# Patient Record
Sex: Female | Born: 1955 | Race: White | Hispanic: No | Marital: Single | Smoking: Never smoker
Health system: Southern US, Community
[De-identification: ages and names within clinical notes are randomized; demographics above are authoritative.]

## PROBLEM LIST (undated history)

## (undated) DIAGNOSIS — R112 Nausea with vomiting, unspecified: Secondary | ICD-10-CM

## (undated) DIAGNOSIS — Z9889 Other specified postprocedural states: Secondary | ICD-10-CM

## (undated) DIAGNOSIS — K219 Gastro-esophageal reflux disease without esophagitis: Secondary | ICD-10-CM

## (undated) DIAGNOSIS — M1990 Unspecified osteoarthritis, unspecified site: Secondary | ICD-10-CM

## (undated) DIAGNOSIS — T8859XA Other complications of anesthesia, initial encounter: Secondary | ICD-10-CM

## (undated) DIAGNOSIS — E119 Type 2 diabetes mellitus without complications: Secondary | ICD-10-CM

## (undated) DIAGNOSIS — T4145XA Adverse effect of unspecified anesthetic, initial encounter: Secondary | ICD-10-CM

## (undated) DIAGNOSIS — Z8719 Personal history of other diseases of the digestive system: Secondary | ICD-10-CM

## (undated) DIAGNOSIS — R7303 Prediabetes: Secondary | ICD-10-CM

## (undated) DIAGNOSIS — K589 Irritable bowel syndrome without diarrhea: Secondary | ICD-10-CM

## (undated) HISTORY — PX: BACK SURGERY: SHX140

## (undated) HISTORY — PX: ABDOMINAL HYSTERECTOMY: SHX81

## (undated) HISTORY — PX: KNEE SURGERY: SHX244

## (undated) HISTORY — PX: CARPAL TUNNEL RELEASE: SHX101

## (undated) HISTORY — PX: SHOULDER SURGERY: SHX246

---

## 2004-01-26 ENCOUNTER — Other Ambulatory Visit: Payer: Self-pay

## 2004-08-06 ENCOUNTER — Ambulatory Visit: Payer: Self-pay | Admitting: Internal Medicine

## 2004-12-02 ENCOUNTER — Emergency Department: Payer: Self-pay | Admitting: Emergency Medicine

## 2004-12-02 ENCOUNTER — Ambulatory Visit: Payer: Self-pay | Admitting: Anesthesiology

## 2005-01-05 ENCOUNTER — Ambulatory Visit: Payer: Self-pay | Admitting: Anesthesiology

## 2005-02-03 ENCOUNTER — Ambulatory Visit: Payer: Self-pay | Admitting: Anesthesiology

## 2005-03-03 ENCOUNTER — Ambulatory Visit: Payer: Self-pay | Admitting: Anesthesiology

## 2005-10-13 ENCOUNTER — Ambulatory Visit: Payer: Self-pay | Admitting: Internal Medicine

## 2005-11-04 ENCOUNTER — Ambulatory Visit: Payer: Self-pay | Admitting: Unknown Physician Specialty

## 2006-02-22 ENCOUNTER — Ambulatory Visit: Payer: Self-pay | Admitting: Anesthesiology

## 2006-03-24 ENCOUNTER — Ambulatory Visit: Payer: Self-pay | Admitting: Unknown Physician Specialty

## 2006-03-31 ENCOUNTER — Ambulatory Visit: Payer: Self-pay | Admitting: Anesthesiology

## 2006-04-12 ENCOUNTER — Other Ambulatory Visit: Payer: Self-pay

## 2006-04-28 ENCOUNTER — Inpatient Hospital Stay: Payer: Self-pay | Admitting: Unknown Physician Specialty

## 2008-02-23 ENCOUNTER — Ambulatory Visit: Payer: Self-pay | Admitting: Internal Medicine

## 2008-07-12 ENCOUNTER — Ambulatory Visit: Payer: Self-pay | Admitting: Internal Medicine

## 2008-08-24 ENCOUNTER — Emergency Department: Payer: Self-pay | Admitting: Internal Medicine

## 2008-08-28 ENCOUNTER — Ambulatory Visit: Payer: Self-pay | Admitting: Gastroenterology

## 2008-09-27 ENCOUNTER — Emergency Department: Payer: Self-pay | Admitting: Emergency Medicine

## 2009-01-03 ENCOUNTER — Ambulatory Visit: Payer: Self-pay | Admitting: Orthopedic Surgery

## 2009-04-03 ENCOUNTER — Ambulatory Visit: Payer: Self-pay | Admitting: Orthopedic Surgery

## 2009-04-11 ENCOUNTER — Ambulatory Visit: Payer: Self-pay | Admitting: Orthopedic Surgery

## 2009-05-08 ENCOUNTER — Ambulatory Visit: Payer: Self-pay | Admitting: Orthopedic Surgery

## 2009-05-15 ENCOUNTER — Inpatient Hospital Stay: Payer: Self-pay | Admitting: Orthopedic Surgery

## 2009-08-23 ENCOUNTER — Ambulatory Visit: Payer: Self-pay | Admitting: Orthopedic Surgery

## 2009-09-08 ENCOUNTER — Ambulatory Visit: Payer: Self-pay | Admitting: Orthopedic Surgery

## 2009-10-10 ENCOUNTER — Ambulatory Visit: Payer: Self-pay | Admitting: Anesthesiology

## 2009-11-08 ENCOUNTER — Ambulatory Visit: Payer: Self-pay | Admitting: Anesthesiology

## 2009-11-23 ENCOUNTER — Ambulatory Visit: Payer: Self-pay | Admitting: Neurosurgery

## 2009-12-06 ENCOUNTER — Ambulatory Visit: Payer: Self-pay | Admitting: Anesthesiology

## 2010-01-29 ENCOUNTER — Ambulatory Visit: Payer: Self-pay | Admitting: Anesthesiology

## 2010-02-10 ENCOUNTER — Ambulatory Visit: Payer: Self-pay | Admitting: Anesthesiology

## 2010-02-11 ENCOUNTER — Encounter: Payer: Self-pay | Admitting: Anesthesiology

## 2010-02-20 ENCOUNTER — Ambulatory Visit: Payer: Self-pay | Admitting: Anesthesiology

## 2010-02-20 ENCOUNTER — Encounter: Payer: Self-pay | Admitting: Anesthesiology

## 2010-04-02 ENCOUNTER — Ambulatory Visit: Payer: Self-pay | Admitting: Anesthesiology

## 2010-05-15 ENCOUNTER — Ambulatory Visit: Payer: Self-pay | Admitting: Anesthesiology

## 2010-05-31 ENCOUNTER — Ambulatory Visit: Payer: Self-pay

## 2010-06-11 ENCOUNTER — Ambulatory Visit: Payer: Self-pay | Admitting: Anesthesiology

## 2010-07-09 ENCOUNTER — Ambulatory Visit: Payer: Self-pay | Admitting: Anesthesiology

## 2010-07-10 ENCOUNTER — Ambulatory Visit: Payer: Self-pay | Admitting: Orthopedic Surgery

## 2010-07-18 ENCOUNTER — Ambulatory Visit (HOSPITAL_COMMUNITY)
Admission: RE | Admit: 2010-07-18 | Discharge: 2010-07-18 | Payer: Self-pay | Source: Home / Self Care | Admitting: Neurosurgery

## 2010-08-15 ENCOUNTER — Ambulatory Visit: Payer: Self-pay | Admitting: Anesthesiology

## 2010-08-20 ENCOUNTER — Ambulatory Visit: Payer: Self-pay | Admitting: Thoracic Surgery

## 2010-10-09 LAB — CBC
HCT: 40 % (ref 36.0–46.0)
Hemoglobin: 13.3 g/dL (ref 12.0–15.0)
MCH: 29.2 pg (ref 26.0–34.0)
MCHC: 33.3 g/dL (ref 30.0–36.0)
MCV: 87.7 fL (ref 78.0–100.0)
Platelets: 273 10*3/uL (ref 150–400)
RBC: 4.56 MIL/uL (ref 3.87–5.11)
RDW: 13.2 % (ref 11.5–15.5)
WBC: 9.3 10*3/uL (ref 4.0–10.5)

## 2010-10-09 LAB — ABO/RH: ABO/RH(D): O POS

## 2010-10-13 ENCOUNTER — Inpatient Hospital Stay (HOSPITAL_COMMUNITY)
Admission: RE | Admit: 2010-10-13 | Discharge: 2010-10-21 | Payer: Self-pay | Source: Home / Self Care | Attending: Neurosurgery | Admitting: Neurosurgery

## 2010-10-20 LAB — CBC
HCT: 30.4 % — ABNORMAL LOW (ref 36.0–46.0)
HCT: 30 % — ABNORMAL LOW (ref 36.0–46.0)
HCT: 25.9 % — ABNORMAL LOW (ref 36.0–46.0)
HCT: 26.9 % — ABNORMAL LOW (ref 36.0–46.0)
HCT: 27.8 % — ABNORMAL LOW (ref 36.0–46.0)
Hemoglobin: 10 g/dL — ABNORMAL LOW (ref 12.0–15.0)
Hemoglobin: 9.6 g/dL — ABNORMAL LOW (ref 12.0–15.0)
Hemoglobin: 8.3 g/dL — ABNORMAL LOW (ref 12.0–15.0)
Hemoglobin: 8.5 g/dL — ABNORMAL LOW (ref 12.0–15.0)
Hemoglobin: 8.8 g/dL — ABNORMAL LOW (ref 12.0–15.0)
MCH: 29.2 pg (ref 26.0–34.0)
MCH: 29.1 pg (ref 26.0–34.0)
MCH: 28.9 pg (ref 26.0–34.0)
MCH: 28.7 pg (ref 26.0–34.0)
MCH: 28.6 pg (ref 26.0–34.0)
MCHC: 32.9 g/dL (ref 30.0–36.0)
MCHC: 32 g/dL (ref 30.0–36.0)
MCHC: 32 g/dL (ref 30.0–36.0)
MCHC: 31.6 g/dL (ref 30.0–36.0)
MCHC: 31.7 g/dL (ref 30.0–36.0)
MCV: 88.6 fL (ref 78.0–100.0)
MCV: 90.9 fL (ref 78.0–100.0)
MCV: 90.2 fL (ref 78.0–100.0)
MCV: 90.9 fL (ref 78.0–100.0)
MCV: 90.3 fL (ref 78.0–100.0)
Platelets: 253 10*3/uL (ref 150–400)
Platelets: 223 10*3/uL (ref 150–400)
Platelets: 270 10*3/uL (ref 150–400)
Platelets: 265 10*3/uL (ref 150–400)
Platelets: 348 10*3/uL (ref 150–400)
RBC: 3.43 MIL/uL — ABNORMAL LOW (ref 3.87–5.11)
RBC: 3.3 MIL/uL — ABNORMAL LOW (ref 3.87–5.11)
RBC: 2.87 MIL/uL — ABNORMAL LOW (ref 3.87–5.11)
RBC: 2.96 MIL/uL — ABNORMAL LOW (ref 3.87–5.11)
RBC: 3.08 MIL/uL — ABNORMAL LOW (ref 3.87–5.11)
RDW: 13.5 % (ref 11.5–15.5)
RDW: 13.5 % (ref 11.5–15.5)
RDW: 13 % (ref 11.5–15.5)
RDW: 13.2 % (ref 11.5–15.5)
RDW: 13.4 % (ref 11.5–15.5)
WBC: 12.5 10*3/uL — ABNORMAL HIGH (ref 4.0–10.5)
WBC: 14.5 10*3/uL — ABNORMAL HIGH (ref 4.0–10.5)
WBC: 13.5 10*3/uL — ABNORMAL HIGH (ref 4.0–10.5)
WBC: 11.1 10*3/uL — ABNORMAL HIGH (ref 4.0–10.5)
WBC: 12.2 10*3/uL — ABNORMAL HIGH (ref 4.0–10.5)

## 2010-10-20 LAB — TYPE AND SCREEN
ABO/RH(D): O POS
Antibody Screen: NEGATIVE
Unit division: 0
Unit division: 0
Unit division: 0
Unit division: 0

## 2010-10-20 LAB — COMPREHENSIVE METABOLIC PANEL
ALT: 37 U/L — ABNORMAL HIGH (ref 0–35)
ALT: 56 U/L — ABNORMAL HIGH (ref 0–35)
AST: 42 U/L — ABNORMAL HIGH (ref 0–37)
AST: 52 U/L — ABNORMAL HIGH (ref 0–37)
Albumin: 2.4 g/dL — ABNORMAL LOW (ref 3.5–5.2)
Albumin: 2.2 g/dL — ABNORMAL LOW (ref 3.5–5.2)
Alkaline Phosphatase: 50 U/L (ref 39–117)
Alkaline Phosphatase: 194 U/L — ABNORMAL HIGH (ref 39–117)
BUN: 8 mg/dL (ref 6–23)
BUN: 6 mg/dL (ref 6–23)
CO2: 25 mEq/L (ref 19–32)
CO2: 31 mEq/L (ref 19–32)
Calcium: 8 mg/dL — ABNORMAL LOW (ref 8.4–10.5)
Calcium: 8.3 mg/dL — ABNORMAL LOW (ref 8.4–10.5)
Chloride: 100 mEq/L (ref 96–112)
Chloride: 101 mEq/L (ref 96–112)
Creatinine, Ser: 0.8 mg/dL (ref 0.4–1.2)
Creatinine, Ser: 0.64 mg/dL (ref 0.4–1.2)
GFR calc Af Amer: >60 mL/min (ref >60)
GFR calc Af Amer: >60 mL/min (ref >60)
GFR calc non Af Amer: >60 mL/min (ref >60)
GFR calc non Af Amer: >60 mL/min (ref >60)
Glucose, Bld: 141 mg/dL — ABNORMAL HIGH (ref 70–99)
Glucose, Bld: 103 mg/dL — ABNORMAL HIGH (ref 70–99)
Potassium: 4 mEq/L (ref 3.5–5.1)
Potassium: 4.2 mEq/L (ref 3.5–5.1)
Sodium: 133 mEq/L — ABNORMAL LOW (ref 135–145)
Sodium: 137 mEq/L (ref 135–145)
Total Bilirubin: 0.6 mg/dL (ref 0.3–1.2)
Total Bilirubin: 0.8 mg/dL (ref 0.3–1.2)
Total Protein: 4.8 g/dL — ABNORMAL LOW (ref 6.0–8.3)
Total Protein: 5.2 g/dL — ABNORMAL LOW (ref 6.0–8.3)

## 2010-10-20 LAB — BASIC METABOLIC PANEL
BUN: 9 mg/dL (ref 6–23)
BUN: 6 mg/dL (ref 6–23)
BUN: 9 mg/dL (ref 6–23)
CO2: 26 mEq/L (ref 19–32)
CO2: 29 mEq/L (ref 19–32)
CO2: 30 mEq/L (ref 19–32)
Calcium: 8.3 mg/dL — ABNORMAL LOW (ref 8.4–10.5)
Calcium: 8.1 mg/dL — ABNORMAL LOW (ref 8.4–10.5)
Calcium: 8.8 mg/dL (ref 8.4–10.5)
Chloride: 108 mEq/L (ref 96–112)
Chloride: 100 mEq/L (ref 96–112)
Chloride: 101 mEq/L (ref 96–112)
Creatinine, Ser: 0.8 mg/dL (ref 0.4–1.2)
Creatinine, Ser: 0.6 mg/dL (ref 0.4–1.2)
Creatinine, Ser: 0.68 mg/dL (ref 0.4–1.2)
GFR calc Af Amer: >60 mL/min (ref >60)
GFR calc Af Amer: >60 mL/min (ref >60)
GFR calc Af Amer: >60 mL/min (ref >60)
GFR calc non Af Amer: >60 mL/min (ref >60)
GFR calc non Af Amer: >60 mL/min (ref >60)
GFR calc non Af Amer: >60 mL/min (ref >60)
Glucose, Bld: 106 mg/dL — ABNORMAL HIGH (ref 70–99)
Glucose, Bld: 114 mg/dL — ABNORMAL HIGH (ref 70–99)
Glucose, Bld: 103 mg/dL — ABNORMAL HIGH (ref 70–99)
Potassium: 4 mEq/L (ref 3.5–5.1)
Potassium: 3.8 mEq/L (ref 3.5–5.1)
Potassium: 4.3 mEq/L (ref 3.5–5.1)
Sodium: 139 mEq/L (ref 135–145)
Sodium: 134 mEq/L — ABNORMAL LOW (ref 135–145)
Sodium: 138 mEq/L (ref 135–145)

## 2010-10-20 LAB — POCT I-STAT 7, (LYTES, BLD GAS, ICA,H+H)
Acid-base deficit: 1 mmol/L (ref 0.0–2.0)
Bicarbonate: 24.3 mEq/L — ABNORMAL HIGH (ref 20.0–24.0)
Calcium, Ion: 1.23 mmol/L (ref 1.12–1.32)
HCT: 29 % — ABNORMAL LOW (ref 36.0–46.0)
Hemoglobin: 9.9 g/dL — ABNORMAL LOW (ref 12.0–15.0)
O2 Saturation: 100 %
Patient temperature: 35.6
Potassium: 3.9 mEq/L (ref 3.5–5.1)
Sodium: 140 mEq/L (ref 135–145)
TCO2: 26 mmol/L (ref 0–100)
pCO2 arterial: 39.4 mmHg (ref 35.0–45.0)
pH, Arterial: 7.392 (ref 7.350–7.400)
pO2, Arterial: 245 mmHg — ABNORMAL HIGH (ref 80.0–100.0)

## 2010-10-20 LAB — GLUCOSE, CAPILLARY
Glucose-Capillary: 171 mg/dL — ABNORMAL HIGH (ref 70–99)
Glucose-Capillary: 116 mg/dL — ABNORMAL HIGH (ref 70–99)
Glucose-Capillary: 101 mg/dL — ABNORMAL HIGH (ref 70–99)
Glucose-Capillary: 120 mg/dL — ABNORMAL HIGH (ref 70–99)

## 2010-10-22 LAB — CBC
HCT: 28 % — ABNORMAL LOW (ref 36.0–46.0)
Hemoglobin: 9 g/dL — ABNORMAL LOW (ref 12.0–15.0)
MCH: 28.8 pg (ref 26.0–34.0)
MCHC: 32.1 g/dL (ref 30.0–36.0)
MCV: 89.5 fL (ref 78.0–100.0)
Platelets: 396 10*3/uL (ref 150–400)
RBC: 3.13 MIL/uL — ABNORMAL LOW (ref 3.87–5.11)
RDW: 13.3 % (ref 11.5–15.5)
WBC: 12.5 10*3/uL — ABNORMAL HIGH (ref 4.0–10.5)

## 2010-10-29 ENCOUNTER — Ambulatory Visit
Admission: RE | Admit: 2010-10-29 | Discharge: 2010-10-29 | Payer: Self-pay | Source: Home / Self Care | Attending: Thoracic Surgery | Admitting: Thoracic Surgery

## 2010-10-29 ENCOUNTER — Encounter
Admission: RE | Admit: 2010-10-29 | Discharge: 2010-10-29 | Payer: Self-pay | Source: Home / Self Care | Attending: Thoracic Surgery | Admitting: Thoracic Surgery

## 2010-10-29 NOTE — Assessment & Plan Note (Addendum)
OFFICE VISIT  Caitlin Kelley, Caitlin Kelley DOB:  1956/08/26                                        October 29, 2010 CHART #:  [id number]  The patient returns today and she is doing remarkably well after a left thoracotomy with fusion of T8-9 far lateral disk herniation.  The incisions are well healed.  Her lungs are clear to auscultation and percussion.  Chest x-ray did show some pleural thickening.  She said she is having some mild swelling in her leg and on both the left and the right leg there was some mild edema, left greater than right with 1 to 2+ edema.  There was no calf tenderness and is only mainly in her lower extremities.  So, I think this may be related to some fluid overload. We gave her Lasix 20 mg, take one every morning.  If her swelling continues, I told her to check with her doctor at [facility].  I will see her back again in 6 weeks with a chest x-ray.  Ines Bloomer, M.D. Electronically Signed  DPB/MEDQ  D:  10/29/2010  T:  10/29/2010  Job:  [id number]

## 2010-10-30 LAB — SURGICAL PCR SCREEN
MRSA, PCR: POSITIVE — AB
Staphylococcus aureus: POSITIVE — AB

## 2010-10-30 NOTE — Op Note (Addendum)
  [name], [name]              ACCOUNT NO.:  [id number]  MEDICAL RECORD NO.:  [id number]          PATIENT TYPE:  INP  LOCATION:  3111                         FACILITY:  [facility]  PHYSICIAN:  Salvatore Decent. Dorris Fetch, M.D.DATE OF BIRTH:  [birth year]  DATE OF PROCEDURE: DATE OF DISCHARGE:                              OPERATIVE REPORT   PREOPERATIVE DIAGNOSIS:  Exposure for spinal fusion.  POSTOPERATIVE DIAGNOSIS:  Exposure for spinal fusion.  PROCEDURE:  Closure of left thoracotomy, placement of On-Q local anesthetic catheter.  SURGEON:  Salvatore Decent. Dorris Fetch, MD  ASSISTANT:  None.  ANESTHESIA:  General.  FINDINGS:  Uncomplicated closure of thoracotomy wound.  CLINICAL NOTE:  Ms. Caitlin Kelley is a 55 year old woman who is undergoing spinal cord procedure by Dr. Tressie Stalker via left thoracotomy approach.  Dr. Karle Plumber performed the thoracotomy, but was tied up in the operating room and unavailable at time for closure.  The wound was inspected for hemostasis.  The On-Q local anesthetic catheter was tunneled into a subpleural location for instillation of continuous local anesthetic.  A 28-French chest tube was placed posterolaterally.  The separate subcostal incisions were secured with 0 silk sutures.  The ribs were reapproximated with #2 Vicryl interrupted figure-of-eight pericostal sutures.  The serratus muscle was reattached to its inferior and lateral attachments with running #1 Vicryl suture.  Latissimus was closed with a running #1 Vicryl suture.  Subcutaneous tissue was closed with running 2-0 Vicryl suture and the skin was closed with 3-0 Vicryl subcuticular suture.  The counts were correct at the end of procedure. An x-ray was performed to rule out any foreign body.  Chest tubes were placed to suction.     Salvatore Decent Dorris Fetch, M.D.     SCH/MEDQ  D:  10/13/2010  T:  10/14/2010  Job:  [id number]  cc:   Ines Bloomer, M.D. Cristi Loron,  M.D.  Electronically Signed by Charlett Lango M.D. on 10/30/2010 11:30:13 AM

## 2010-11-03 ENCOUNTER — Ambulatory Visit: Payer: Self-pay | Admitting: Anesthesiology

## 2010-11-11 NOTE — Discharge Summary (Signed)
  Caitlin Kelley, Caitlin Kelley              ACCOUNT NO.:  [id number]  MEDICAL RECORD NO.:  [id number]          PATIENT TYPE:  INP  LOCATION:  3006                         FACILITY:  [facility]  PHYSICIAN:  Cristi Loron, M.D.DATE OF BIRTH:  1956/05/12  DATE OF ADMISSION:  10/13/2010 DATE OF DISCHARGE:  10/21/2010                              DISCHARGE SUMMARY   BRIEF HISTORY:  The patient is a 55 year old white female who suffered from thoracic pain and symptoms consistent with thoracic myelopathy. She has failed medical management and was worked up with a thoracic MRI and a thoracic Myelo-CT which demonstrated the patient had spondylosis, stenosis, etc., at T7-8 and T8-9.  I discussed the various treatment options with the patient including surgery.  She has weighed the risks, benefits, and alternatives of surgery and decided to proceed with a T8 corpectomy for decompression of C7-T8 and T8-T9 with fusion and plating.  For further details of this admission, please refer to the typed history and physical.  HOSPITAL COURSE:  Admitted the patient to [facility] on October 13, 2010.  On the day of admission, I performed a T8 corpectomy with fusion and instrumentation of T7-T9.  The surgery went well and (for full details of this operation, please refer to typed operative note).  POSTOPERATIVE COURSE:  The patient's postoperative course was as follows.  The patient was initially monitored in the ICU.  Dr. Edwyna Shell did a thoracotomy and managed the chest tubes.  They will be removed in the next several days.  The patient was transferred to the neurosurgical general floor where she had PT/OT.  By October 22, 2010, the patient was felt to be ready for discharge home.  She was able to discharge home.  DISCHARGE INSTRUCTIONS:  The patient was given written discharge instructions with instructions to follow up with me in 4 weeks and Dr. Edwyna Shell per his instructions.  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  PROCEDURES PERFORMED:  Thoracotomy for T8 corpectomy with partial T9 corpectomy to decompress to T7-8 and T8-9 interspaces; T7-8, T8-9 anterior arthrodesis with local morselized autograft bone and Actifuse bone graft extender; insertion of interbody prosthesis at the T8 corpectomy site (Globus PEEK interbody prosthesis), anterior plate and screws at T7-T9 with Globus titanium plate and screws.    Cristi Loron, M.D.    JDJ/MEDQ  D:  11/06/2010  T:  11/07/2010  Job:  [id number]  cc:   Ines Bloomer, M.D.  Electronically Signed by Tressie Stalker M.D. on 11/11/2010 01:40:08 PM

## 2010-12-08 ENCOUNTER — Other Ambulatory Visit: Payer: Self-pay | Admitting: Thoracic Surgery

## 2010-12-08 DIAGNOSIS — M5124 Other intervertebral disc displacement, thoracic region: Secondary | ICD-10-CM

## 2010-12-09 ENCOUNTER — Ambulatory Visit
Admission: RE | Admit: 2010-12-09 | Discharge: 2010-12-09 | Disposition: A | Discharge status: Home / Self Care | Payer: PRIVATE HEALTH INSURANCE | Source: Ambulatory Visit | Attending: Thoracic Surgery | Admitting: Thoracic Surgery

## 2010-12-09 ENCOUNTER — Ambulatory Visit (INDEPENDENT_AMBULATORY_CARE_PROVIDER_SITE_OTHER): Payer: Self-pay | Admitting: Thoracic Surgery

## 2010-12-09 DIAGNOSIS — M5124 Other intervertebral disc displacement, thoracic region: Secondary | ICD-10-CM

## 2010-12-30 ENCOUNTER — Ambulatory Visit: Payer: Self-pay | Admitting: Anesthesiology

## 2011-01-12 NOTE — Assessment & Plan Note (Signed)
OFFICE VISIT  Caitlin Kelley, Caitlin Kelley DOB:  1956/09/09                                        December 09, 2010 CHART #:  [id number]  The patient came today.  Her incisions are well healed and chest x-ray shows there is some thickening on left side.  No evidence of any problems as far as effusion.  Her blood pressure is 131/71, pulse 81, respirations 20, sats were 97%.  She is having minimal pain.  She is doing well overall, has made remarkable recovery.  I will let Dr. Lovell Sheehan to follow from now on, but she is fully released from my standpoint.  She is still on hydrocodone.  Ines Bloomer, M.D. Electronically Signed  DPB/MEDQ  D:  12/09/2010  T:  12/09/2010  Job:  [id number]  cc:   Stacie Glaze, MD

## 2011-02-17 NOTE — Letter (Signed)
August 20, 2010   Cristi Loron, M.D.  [address]  [place], [place] [address]   Re:  Caitlin Kelley, Caitlin Kelley                DOB:  August 01, 1956   Dear Trey Paula,   I appreciate the opportunity of seeing the patient.  This 55 year old  Caucasian female who has had a long history of leg and back pain as well  as neck pain.  She underwent both cervical, thoracic and lumbar CT scans  and myelogram of her thoracic spine showed a T7, T8 spinal cord  compression with T8-9 being the most severe.  She has been seen by the  pain management, had multiple medications and she is being seen for T7-8  and T8-9 diskectomies with interbody fusion and anterior  instrumentation.  She agrees to the surgery and I saw her today for  exposure these disks.  I think the left side would probably be the best  way to do this.  It could be done from the right side, but the left  since it has gotten down to the 9th interspace would probably be the  approach of choice.   Her medications include Prilosec 20 mg day, ibuprofen, Lyrica, Zanaflex  6 mg at night, hydrocodone and Zoloft.   She is allergic to penicillin, Lodine, Levaquin, morphine, Percocet.   FAMILY HISTORY:  Noncontributory.   SOCIAL HISTORY:  She is single, has 3 children.  Works as a Lawyer.  Does  not smoke or drink.   REVIEW OF SYSTEMS:  She is 160 pounds.  She is 5 feet.  GENERAL:  Weight has been stable.  CARDIAC:  No angina or atrial fibrillation.  PULMONARY:  No hemoptysis, excessive sputum, asthma, wheezing.  GI:  She has reflux and hiatal hernia.  GU:  No kidney disease but does have intermittent incontinence.  VASCULAR:  She has a severe pain in the legs related to her back  disease.  No DVT or TIAs.  NEUROLOGICAL:  She has migraines.  MUSCULOSKELETAL:  See history of present illness.  PSYCHIATRIC:  Depression.  EYES/ENT:  She wears 2 hearing aids.  She has had problems with hearing  since birth.  No change in eyesight.  HEMATOLOGICAL:  No problems with bleeding, clotting disorders or anemia   PHYSICAL EXAMINATION:  Blood pressure is 134/77, pulse 78, respirations  18, saturations were 96%.  Head, Eyes, Ears, Nose and Throat:  Unremarkable.  NECK:  Supple without thyromegaly.  There is no  supraclavicular or axillary adenopathy.  Chest:  Clear to auscultation  and percussion.  Heart:  Regular sinus rhythm.  No murmurs.  Abdomen:  Soft.  There is no hepatosplenomegaly.  Extremities:  Pulses are 2+.  There is no clubbing or edema.  Neurological:  She is oriented x3.  Sensory and motor intact.  Cranial nerves were intact.   I think the best way to approach this is with a left thoracotomy and I  will discuss this with you and my office will contact you regarding  scheduling.   I appreciate the opportunity of seeing the patient.   Ines Bloomer, M.D.  Electronically Signed   DPB/MEDQ  D:  08/20/2010  T:  08/21/2010  Job:  [id number]

## 2011-02-20 IMAGING — CR DG KNEE 1-2V*R*
1 series · 2 of 2 positions shown · non-contrast
Comparison: none

REASON FOR EXAM: postop
COMMENTS:   Bedside (portable):Y

PROCEDURE:     DXR - DXR KNEE RIGHT AP AND LATERAL  - May 15, 2009 [DATE]
RESULT:     The patient is status post replacement of the medial knee
components. No fracture about the femoral or tibial prosthetic components is
seen. There is no dislocation at the knee.

[Series 1: view not recorded · 0.17mm/px · 2 of 2 slices shown]
[im 1/2]
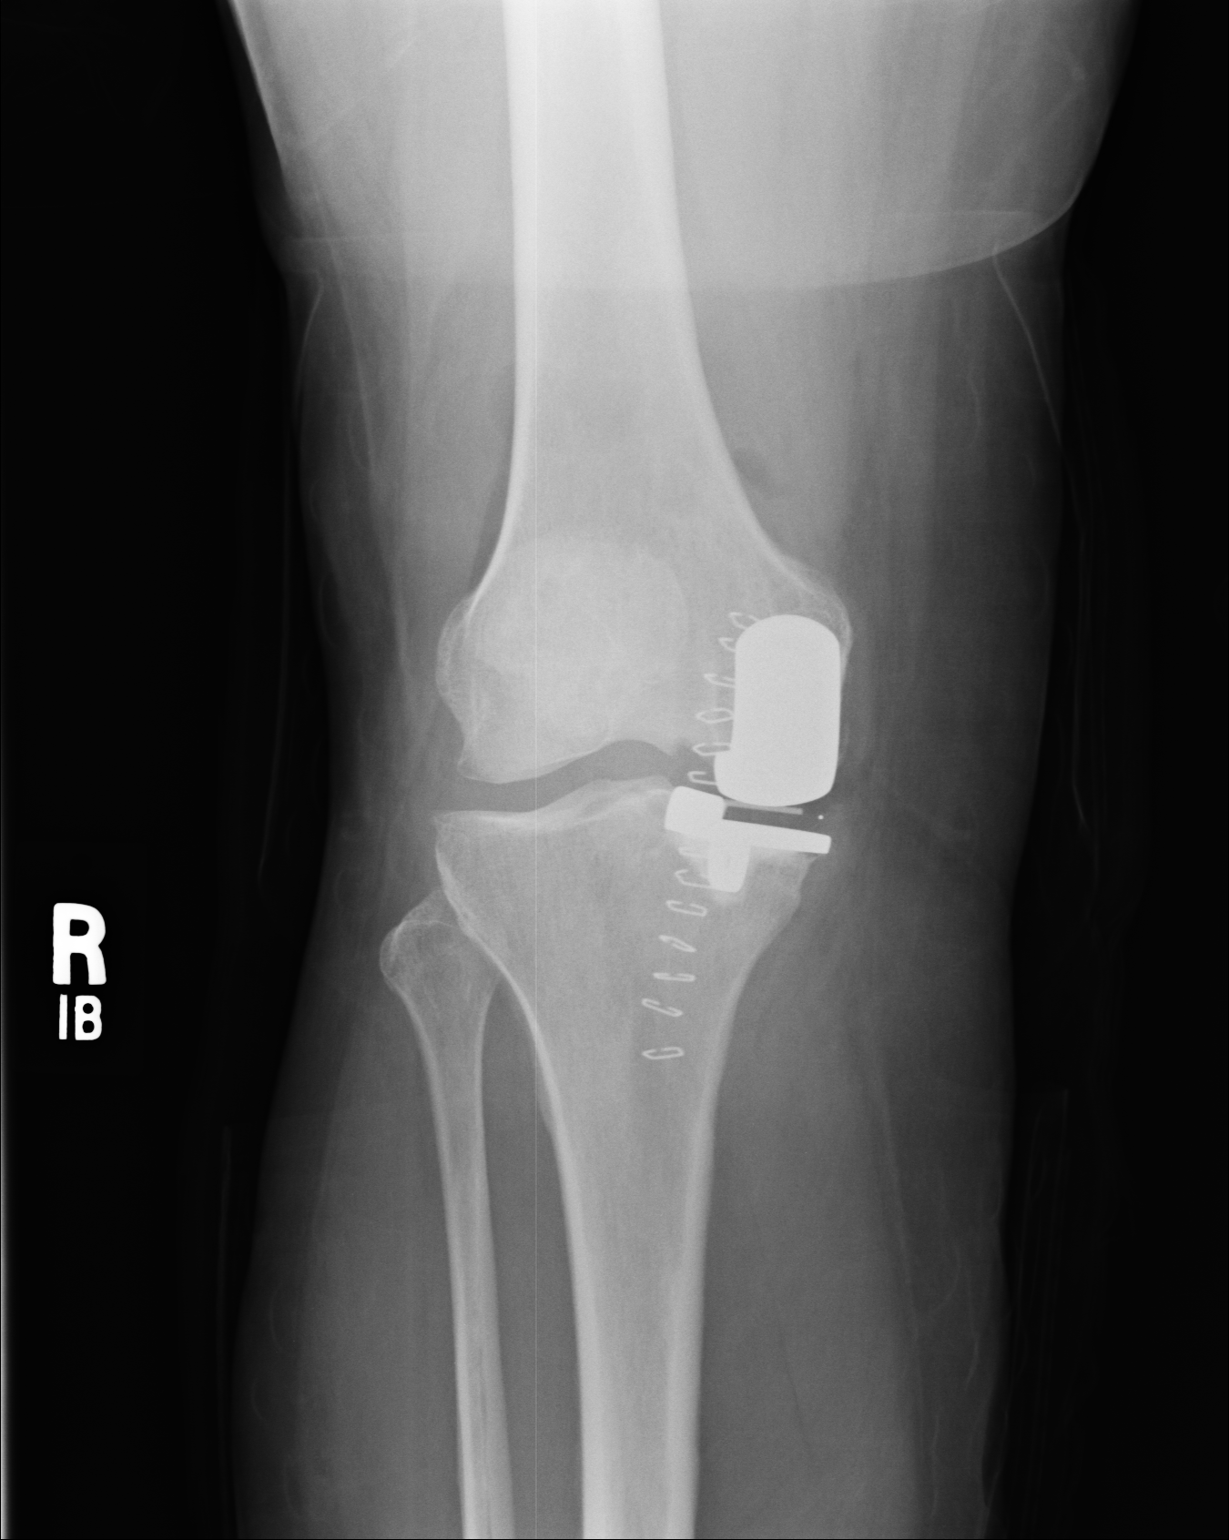
[im 2/2]
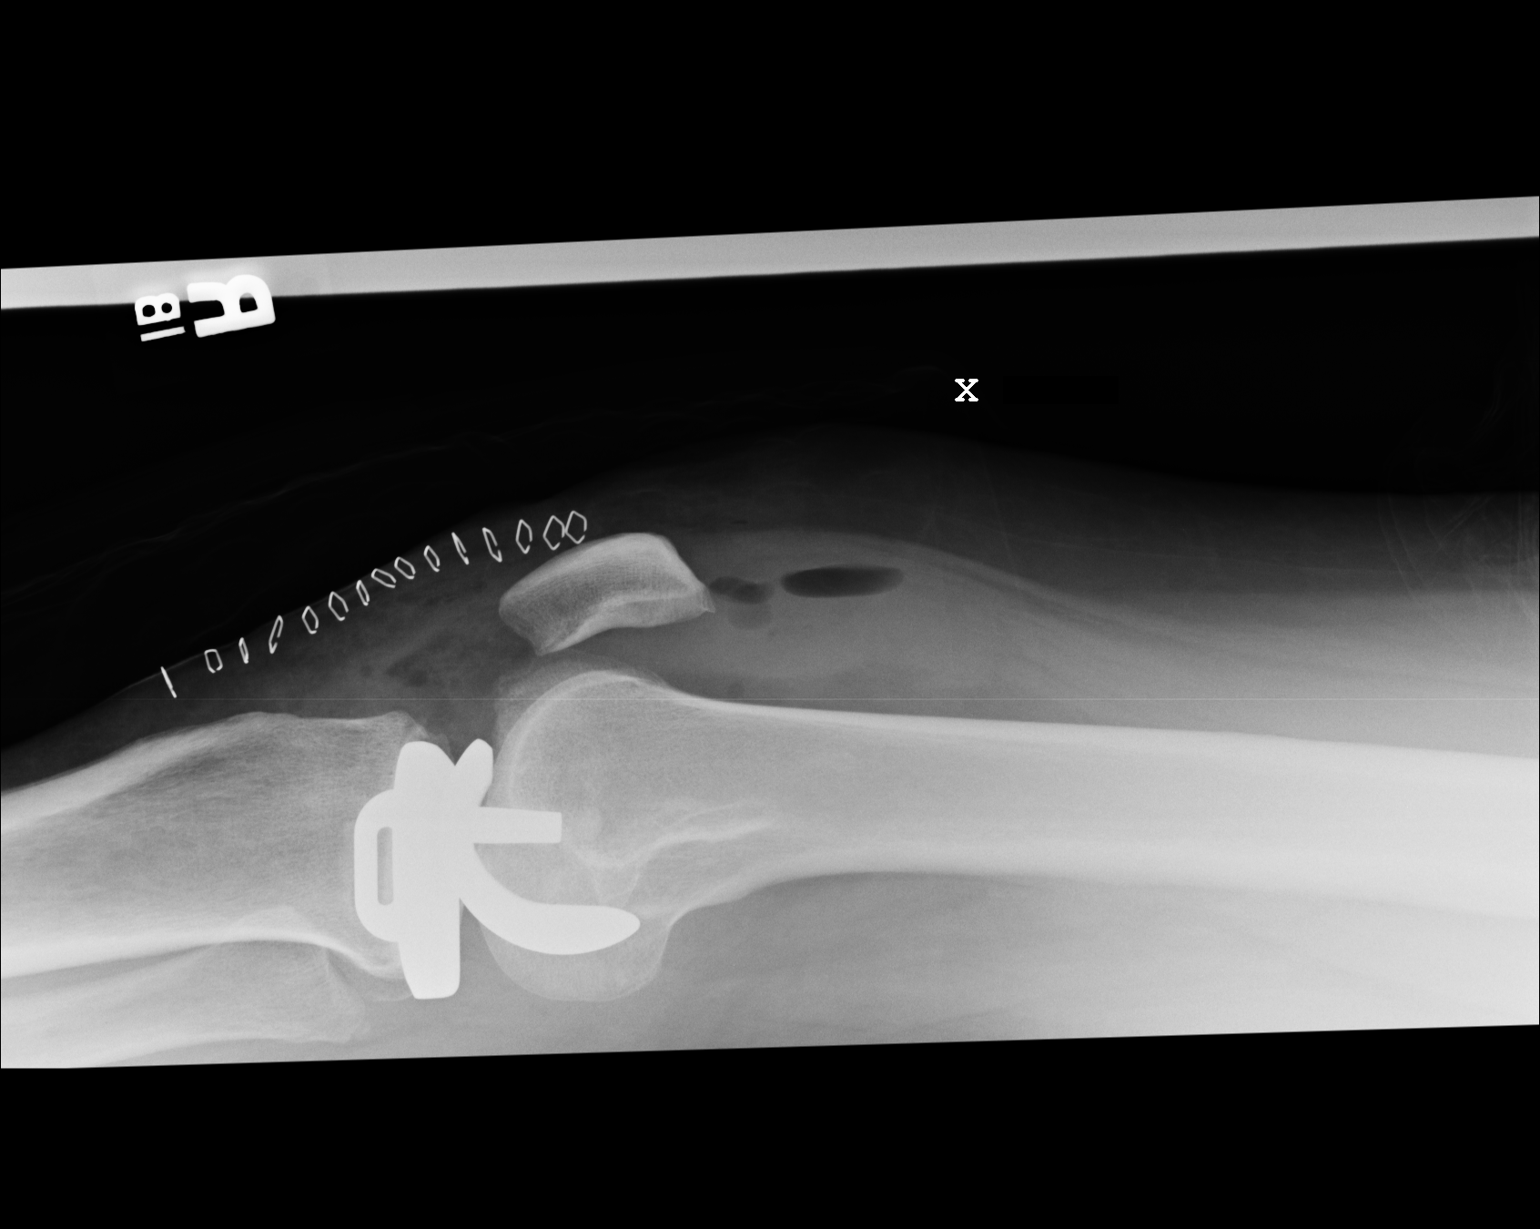

[2 of 2 positions shown; findings below may reference images not displayed]

IMPRESSION: No abnormal post operative changes are identified.

## 2011-04-13 ENCOUNTER — Inpatient Hospital Stay: Payer: Self-pay | Admitting: Internal Medicine

## 2013-07-04 ENCOUNTER — Ambulatory Visit: Payer: Self-pay | Admitting: Family Medicine

## 2013-09-18 ENCOUNTER — Ambulatory Visit: Payer: Self-pay | Admitting: Gastroenterology

## 2014-02-16 ENCOUNTER — Emergency Department: Payer: Self-pay | Admitting: Emergency Medicine

## 2014-02-16 LAB — CBC WITH DIFFERENTIAL/PLATELET
BASOS ABS: 0 10*3/uL (ref 0.0–0.1)
Basophil %: 0.4 %
EOS PCT: 0 %
Eosinophil #: 0 10*3/uL (ref 0.0–0.7)
HCT: 42.6 % (ref 35.0–47.0)
HGB: 14 g/dL (ref 12.0–16.0)
LYMPHS PCT: 8.9 %
Lymphocyte #: 0.9 10*3/uL — ABNORMAL LOW (ref 1.0–3.6)
MCH: 29 pg (ref 26.0–34.0)
MCHC: 32.9 g/dL (ref 32.0–36.0)
MCV: 88 fL (ref 80–100)
Monocyte #: 0.5 x10 3/mm (ref 0.2–0.9)
Monocyte %: 5.2 %
Neutrophil #: 8.8 10*3/uL — ABNORMAL HIGH (ref 1.4–6.5)
Neutrophil %: 85.5 %
Platelet: 190 10*3/uL (ref 150–440)
RBC: 4.83 10*6/uL (ref 3.80–5.20)
RDW: 13.9 % (ref 11.5–14.5)
WBC: 10.3 10*3/uL (ref 3.6–11.0)

## 2014-02-17 LAB — COMPREHENSIVE METABOLIC PANEL
ALBUMIN: 4 g/dL (ref 3.4–5.0)
ALK PHOS: 136 U/L — AB
ALT: 61 U/L (ref 12–78)
ANION GAP: 8 (ref 7–16)
BILIRUBIN TOTAL: 0.4 mg/dL (ref 0.2–1.0)
BUN: 10 mg/dL (ref 7–18)
CHLORIDE: 108 mmol/L — AB (ref 98–107)
CO2: 21 mmol/L (ref 21–32)
CREATININE: 0.96 mg/dL (ref 0.60–1.30)
Calcium, Total: 9 mg/dL (ref 8.5–10.1)
EGFR (Non-African Amer.): >60
GLUCOSE: 140 mg/dL — AB (ref 65–99)
Osmolality: 275 (ref 275–301)
Potassium: 3.5 mmol/L (ref 3.5–5.1)
SGOT(AST): 73 U/L — ABNORMAL HIGH (ref 15–37)
SODIUM: 137 mmol/L (ref 136–145)
Total Protein: 7.3 g/dL (ref 6.4–8.2)

## 2014-02-17 LAB — URINALYSIS, COMPLETE
Bacteria: NONE SEEN
Bilirubin,UR: NEGATIVE
Glucose,UR: NEGATIVE mg/dL (ref 0–75)
KETONE: NEGATIVE
NITRITE: NEGATIVE
Ph: 6 (ref 4.5–8.0)
Protein: 30
Specific Gravity: 1.018 (ref 1.003–1.030)
Squamous Epithelial: 8

## 2014-03-05 ENCOUNTER — Other Ambulatory Visit: Payer: Self-pay | Admitting: Ophthalmology

## 2014-03-05 LAB — SEDIMENTATION RATE: Erythrocyte Sed Rate: 18 mm/hr (ref 0–30)

## 2014-09-09 ENCOUNTER — Emergency Department: Payer: Self-pay | Admitting: Student

## 2015-02-21 ENCOUNTER — Other Ambulatory Visit: Payer: Self-pay | Admitting: Family Medicine

## 2015-02-21 DIAGNOSIS — Z1231 Encounter for screening mammogram for malignant neoplasm of breast: Secondary | ICD-10-CM

## 2015-03-06 ENCOUNTER — Ambulatory Visit
Admission: RE | Admit: 2015-03-06 | Discharge: 2015-03-06 | Disposition: A | Discharge status: Home / Self Care | Payer: Medicare Other | Source: Ambulatory Visit | Attending: Family Medicine | Admitting: Family Medicine

## 2015-03-06 DIAGNOSIS — Z1231 Encounter for screening mammogram for malignant neoplasm of breast: Secondary | ICD-10-CM | POA: Insufficient documentation

## 2015-03-06 DIAGNOSIS — R922 Inconclusive mammogram: Secondary | ICD-10-CM | POA: Diagnosis not present

## 2015-04-08 ENCOUNTER — Emergency Department
Admission: EM | Admit: 2015-04-08 | Discharge: 2015-04-08 | Disposition: A | Discharge status: Home / Self Care | Payer: Medicare Other | Attending: Emergency Medicine | Admitting: Emergency Medicine

## 2015-04-08 ENCOUNTER — Encounter: Payer: Self-pay | Admitting: *Deleted

## 2015-04-08 DIAGNOSIS — N390 Urinary tract infection, site not specified: Secondary | ICD-10-CM | POA: Insufficient documentation

## 2015-04-08 DIAGNOSIS — R350 Frequency of micturition: Secondary | ICD-10-CM | POA: Diagnosis present

## 2015-04-08 DIAGNOSIS — Z880 Allergy status to penicillin: Secondary | ICD-10-CM | POA: Insufficient documentation

## 2015-04-08 HISTORY — DX: Irritable bowel syndrome, unspecified: K58.9

## 2015-04-08 HISTORY — DX: Gastro-esophageal reflux disease without esophagitis: K21.9

## 2015-04-08 LAB — URINALYSIS COMPLETE WITH MICROSCOPIC (ARMC ONLY)
BILIRUBIN URINE: NEGATIVE
GLUCOSE, UA: NEGATIVE mg/dL
KETONES UR: NEGATIVE mg/dL
NITRITE: POSITIVE — AB
PH: 6 (ref 5.0–8.0)
PROTEIN: 30 mg/dL — AB
Specific Gravity, Urine: 1.01 (ref 1.005–1.030)

## 2015-04-08 MED ORDER — PHENAZOPYRIDINE HCL 200 MG PO TABS: 200.0000 mg | ORAL_TABLET | Freq: Three times a day (TID) | ORAL | Status: AC | PRN

## 2015-04-08 MED ORDER — KETOROLAC TROMETHAMINE 60 MG/2ML IM SOLN
60.0000 mg | Freq: Once | INTRAMUSCULAR | Status: AC
  Administered 2015-04-08: 60 mg via INTRAMUSCULAR

## 2015-04-08 MED ORDER — SULFAMETHOXAZOLE-TRIMETHOPRIM 800-160 MG PO TABS
1.0000 | ORAL_TABLET | Freq: Once | ORAL | Status: AC
  Administered 2015-04-08: 1 via ORAL

## 2015-04-08 MED ORDER — SULFAMETHOXAZOLE-TRIMETHOPRIM 800-160 MG PO TABS
1.0000 | ORAL_TABLET | Freq: Two times a day (BID) | ORAL | Status: DC
Start: 2015-04-08 — End: 2016-07-27

## 2015-04-08 MED ORDER — KETOROLAC TROMETHAMINE 60 MG/2ML IM SOLN
INTRAMUSCULAR | Status: AC
  Administered 2015-04-08: 60 mg via INTRAMUSCULAR
  Filled 2015-04-08: qty 2

## 2015-04-08 MED ORDER — SULFAMETHOXAZOLE-TRIMETHOPRIM 800-160 MG PO TABS
ORAL_TABLET | ORAL | Status: AC
  Administered 2015-04-08: 1 via ORAL
  Filled 2015-04-08: qty 1

## 2015-04-08 NOTE — ED Provider Notes (Addendum)
[facility] Emergency Department Provider Note  Time seen: 5:41 PM  I have reviewed the triage vital signs and the nursing notes.   HISTORY  Chief Complaint Urinary Frequency    HPI Caitlin Kelley is a 59 y.o. female with a past medical history of GERD and IBS who presents the emergency department for 1 week of lower abdominal pain, and dysuria. According to the patient for the past one week she has noticed a foul odor to her urine, urinary frequency, and now with lower abdominal pain and mild left back pain. States she has felt chills but does not know if she ever had a fever. States the pain has become worse so she came to the emergency department for evaluation.Patient describes her symptoms as moderate, worse with urination. Denies fever, nausea, vomiting, diarrhea, vaginal symptoms.     Past Medical History  Diagnosis Date  . GERD (gastroesophageal reflux disease)   . IBS (irritable bowel syndrome)     There are no active problems to display for this patient.   Past Surgical History  Procedure Laterality Date  . Back surgery    . Knee surgery      No current outpatient prescriptions on file.  Allergies Penicillins; Amoxicillin; Lodine; Morphine and related; and Percocet  No family history on file.  Social History History  Substance Use Topics  . Smoking status: Never Smoker   . Smokeless tobacco: Not on file  . Alcohol Use: No    Review of Systems Constitutional: Negative for fever. Occasional chills. Cardiovascular: Negative for chest pain. Respiratory: Negative for shortness of breath. Gastrointestinal: Positive for lower abdominal pain. Negative for nausea, vomiting, diarrhea. Genitourinary: Positive for dysuria, frequency, and urine odor. Musculoskeletal: Mild left back pain.  10-point ROS otherwise negative.  ____________________________________________   PHYSICAL EXAM:  VITAL SIGNS: ED Triage Vitals  Enc Vitals  Group     BP 04/08/15 1706 157/77 mmHg     Pulse Rate 04/08/15 1706 112     Resp 04/08/15 1706 20     Temp 04/08/15 1706 98.7 F (37.1 C)     Temp Source 04/08/15 1706 Oral     SpO2 04/08/15 1706 100 %     Weight 04/08/15 1706 200 lb (90.719 kg)     Height 04/08/15 1706 4\' 11"  (1.499 m)     Head Cir --      Peak Flow --      Pain Score 04/08/15 1710 10     Pain Loc --      Pain Edu? --      Excl. in GC? --     Constitutional: Alert and oriented. Mild distress due to discomfort. ENT   Head: Normocephalic and atraumatic.   Mouth/Throat: Mucous membranes are moist. Cardiovascular: Normal rate, regular rhythm. No murmur Respiratory: Normal respiratory effort without tachypnea nor retractions. Breath sounds are clear and equal bilaterally. Gastrointestinal: Soft, mild suprapubic tenderness to palpation. No rebound or guarding. Mild left CVA tenderness to palpation. Musculoskeletal: Nontender with normal range of motion in all extremities.  Neurologic:  Normal speech and language. No gross focal neurologic deficits  Skin:  Skin is warm, dry and intact.  Psychiatric: Mood and affect are normal. Speech and behavior are normal.   ____________________________________________   INITIAL IMPRESSION / ASSESSMENT AND PLAN / ED COURSE  Pertinent labs & imaging results that were available during my care of the patient were reviewed by me and considered in my medical decision making (see chart  for details).  Patient with dysuria, frequency, foul odor times one week. Now with lower abdominal pain and left back pain as well. Question possible urinary tract infection versus early pyelonephritis. We will check a urinalysis, and dosed Toradol for her discomfort.   Urinalysis consistent with significant urinary tract infection. Vital show mild/low grade temperature 100.3 otherwise within normal limits. Patient feeling better after Toradol. We'll place the patient on Bactrim for a UTI/early  pyelonephritis. I discussed very strict return precautions with the patient to a she is agreeable and she will follow-up with her primary care doctor. We have dosed the patient Bactrim in the emergency department and she will fill her prescription tomorrow.  ____________________________________________   FINAL CLINICAL IMPRESSION(S) / ED DIAGNOSES  Lower abdominal pain Dysuria Urinary tract infection   Minna Antis, MD 04/08/15 1845  Minna Antis, MD 04/08/15 [phone]

## 2015-04-08 NOTE — ED Notes (Signed)
C/o pain ful urination, urgency

## 2015-04-08 NOTE — Discharge Instructions (Signed)

## 2015-09-02 ENCOUNTER — Emergency Department
Admission: EM | Admit: 2015-09-02 | Discharge: 2015-09-02 | Disposition: A | Discharge status: Home / Self Care | Payer: Medicare Other | Attending: Emergency Medicine | Admitting: Emergency Medicine

## 2015-09-02 DIAGNOSIS — M62830 Muscle spasm of back: Secondary | ICD-10-CM | POA: Insufficient documentation

## 2015-09-02 DIAGNOSIS — M5432 Sciatica, left side: Secondary | ICD-10-CM

## 2015-09-02 DIAGNOSIS — Z792 Long term (current) use of antibiotics: Secondary | ICD-10-CM | POA: Insufficient documentation

## 2015-09-02 DIAGNOSIS — Z880 Allergy status to penicillin: Secondary | ICD-10-CM | POA: Diagnosis not present

## 2015-09-02 DIAGNOSIS — M545 Low back pain: Secondary | ICD-10-CM | POA: Diagnosis present

## 2015-09-02 DIAGNOSIS — M5442 Lumbago with sciatica, left side: Secondary | ICD-10-CM | POA: Diagnosis not present

## 2015-09-02 MED ORDER — DIAZEPAM 2 MG PO TABS
2.0000 mg | ORAL_TABLET | Freq: Once | ORAL | Status: AC
  Administered 2015-09-02: 2 mg via ORAL
  Filled 2015-09-02: qty 1

## 2015-09-02 MED ORDER — HYDROCODONE-ACETAMINOPHEN 5-325 MG PO TABS
1.0000 | ORAL_TABLET | Freq: Once | ORAL | Status: AC
Start: 2015-09-02 — End: 2015-09-02
  Administered 2015-09-02: 1 via ORAL
  Filled 2015-09-02: qty 1

## 2015-09-02 MED ORDER — DIAZEPAM 2 MG PO TABS: 2.0000 mg | ORAL_TABLET | Freq: Three times a day (TID) | ORAL | Status: DC | PRN

## 2015-09-02 MED ORDER — HYDROCODONE-ACETAMINOPHEN 5-325 MG PO TABS: 1.0000 | ORAL_TABLET | Freq: Four times a day (QID) | ORAL | Status: DC | PRN

## 2015-09-02 MED ORDER — ONDANSETRON 4 MG PO TBDP
4.0000 mg | ORAL_TABLET | Freq: Once | ORAL | Status: AC
  Administered 2015-09-02: 4 mg via ORAL
  Filled 2015-09-02: qty 1

## 2015-09-02 NOTE — ED Notes (Signed)
Patient ambulatory to triage with steady gait, without difficulty or distress noted; pt reports lower back pain radiating down left leg; st hx sciatica and back surgeries

## 2015-09-02 NOTE — ED Provider Notes (Signed)
[facility] Emergency Department Provider Note  ____________________________________________  Time seen: Approximately 6:12 AM  I have reviewed the triage vital signs and the nursing notes.   HISTORY  Chief Complaint Back Pain    HPI Caitlin Kelley is a 59 y.o. female who presents to the ED from home with a chief complaint of lower back pain. Patient reports a history of sciatica and prior back surgeries. States she has been experiencing sciatica type pain for the past 6 months, worse for the past 2 weeks. She was unable to rest tonight secondary to the pain. Describes sharp shooting pain extending from her left lower back radiating into her left leg. Complains of some numbness of that leg which is baseline for patient. Denies associated symptoms of leg weakness, bowel or bladder incontinence. Denies fever, chills, chest pain, shortness of breath, abdominal pain, nausea, vomiting, diarrhea, dysuria. Nothing makes her pain better. Movement makes her pain worse.   Past Medical History  Diagnosis Date  . GERD (gastroesophageal reflux disease)   . IBS (irritable bowel syndrome)     There are no active problems to display for this patient.   Past Surgical History  Procedure Laterality Date  . Back surgery    . Knee surgery      Current Outpatient Rx  Name  Route  Sig  Dispense  Refill  . phenazopyridine (PYRIDIUM) 200 MG tablet   Oral   Take 1 tablet (200 mg total) by mouth 3 (three) times daily as needed for pain.   20 tablet   0   . sulfamethoxazole-trimethoprim (BACTRIM DS,SEPTRA DS) 800-160 MG per tablet   Oral   Take 1 tablet by mouth 2 (two) times daily.   20 tablet   0     Allergies Penicillins; Amoxicillin; Lodine; Morphine and related; and Percocet  No family history on file.  Social History Social History  Substance Use Topics  . Smoking status: Never Smoker   . Smokeless tobacco: Not on file  . Alcohol Use: No    Review of  Systems Constitutional: No fever/chills Eyes: No visual changes. ENT: No sore throat. Cardiovascular: Denies chest pain. Respiratory: Denies shortness of breath. Gastrointestinal: No abdominal pain.  No nausea, no vomiting.  No diarrhea.  No constipation. Genitourinary: Negative for dysuria. Musculoskeletal: Positive for back pain. Skin: Negative for rash. Neurological: Negative for headaches, focal weakness or numbness.  10-point ROS otherwise negative.  ____________________________________________   PHYSICAL EXAM:  VITAL SIGNS: ED Triage Vitals  Enc Vitals Group     BP 09/02/15 0525 140/75 mmHg     Pulse Rate 09/02/15 0525 90     Resp 09/02/15 0525 20     Temp 09/02/15 0525 97.9 F (36.6 C)     Temp Source 09/02/15 0525 Oral     SpO2 09/02/15 0525 98 %     Weight 09/02/15 0525 200 lb (90.719 kg)     Height 09/02/15 0525  (1.473 m)     Head Cir --      Peak Flow --      Pain Score 09/02/15 0525 10     Pain Loc --      Pain Edu? --      Excl. in GC? --     Constitutional: Alert and oriented. Well appearing and in mild acute distress. Eyes: Conjunctivae are normal. PERRL. EOMI. Head: Atraumatic. Nose: No congestion/rhinnorhea. Mouth/Throat: Mucous membranes are moist.  Oropharynx non-erythematous. Neck: No stridor.  No cervical spine  tenderness to palpation. Cardiovascular: Normal rate, regular rhythm. Grossly normal heart sounds.  Good peripheral circulation. Respiratory: Normal respiratory effort.  No retractions. Lungs CTAB. Gastrointestinal: Soft and nontender. No distention. No abdominal bruits. No CVA tenderness. Musculoskeletal: No midline lumbar spinal tenderness. Left paraspinal muscle spasms. Tender to palpation left buttock. Straight leg raise positive at 45. No lower extremity tenderness nor edema.  No joint effusions. Neurologic:  Normal speech and language. No gross focal neurologic deficits are appreciated. No gait instability. Skin:  Skin is  warm, dry and intact. No rash noted. Psychiatric: Mood and affect are normal. Speech and behavior are normal.  ____________________________________________   LABS (all labs ordered are listed, but only abnormal results are displayed)  Labs Reviewed - No data to display ____________________________________________  EKG  None ____________________________________________  RADIOLOGY  None ____________________________________________   PROCEDURES  Procedure(s) performed: None  Critical Care performed: No  ____________________________________________   INITIAL IMPRESSION / ASSESSMENT AND PLAN / ED COURSE  Pertinent labs & imaging results that were available during my care of the patient were reviewed by me and considered in my medical decision making (see chart for details).  59 year old female with acute left-sided sciatica without focal neurological deficits. Patient has an allergy to NSAIDs; will treat with analgesia and muscle relaxer. Strict return precautions given. Patient verbalizes understanding and agrees with plan of care. ____________________________________________   FINAL CLINICAL IMPRESSION(S) / ED DIAGNOSES  Final diagnoses:  Sciatica of left side      Irean HongJade J Sung, MD 09/02/15 [phone]

## 2015-09-02 NOTE — Discharge Instructions (Signed)
1. Take medicines as needed for pain and muscle spasms (Norco/Valium #15). 2. Apply moist heat to affected area several times daily. 3. Return to the ER for worsening symptoms, fever, persistent vomiting, difficulty breathing or other concerns.  Sciatica Sciatica is pain, weakness, numbness, or tingling along the path of the sciatic nerve. The nerve starts in the lower back and runs down the back of each leg. The nerve controls the muscles in the lower leg and in the back of the knee, while also providing sensation to the back of the thigh, lower leg, and the sole of your foot. Sciatica is a symptom of another medical condition. For instance, nerve damage or certain conditions, such as a herniated disk or bone spur on the spine, pinch or put pressure on the sciatic nerve. This causes the pain, weakness, or other sensations normally associated with sciatica. Generally, sciatica only affects one side of the body. CAUSES   Herniated or slipped disc.  Degenerative disk disease.  A pain disorder involving the narrow muscle in the buttocks (piriformis syndrome).  Pelvic injury or fracture.  Pregnancy.  Tumor (rare). SYMPTOMS  Symptoms can vary from mild to very severe. The symptoms usually travel from the low back to the buttocks and down the back of the leg. Symptoms can include:  Mild tingling or dull aches in the lower back, leg, or hip.  Numbness in the back of the calf or sole of the foot.  Burning sensations in the lower back, leg, or hip.  Sharp pains in the lower back, leg, or hip.  Leg weakness.  Severe back pain inhibiting movement. These symptoms may get worse with coughing, sneezing, laughing, or prolonged sitting or standing. Also, being overweight may worsen symptoms. DIAGNOSIS  Your caregiver will perform a physical exam to look for common symptoms of sciatica. He or she may ask you to do certain movements or activities that would trigger sciatic nerve pain. Other tests  may be performed to find the cause of the sciatica. These may include:  Blood tests.  X-rays.  Imaging tests, such as an MRI or CT scan. TREATMENT  Treatment is directed at the cause of the sciatic pain. Sometimes, treatment is not necessary and the pain and discomfort goes away on its own. If treatment is needed, your caregiver may suggest:  Over-the-counter medicines to relieve pain.  Prescription medicines, such as anti-inflammatory medicine, muscle relaxants, or narcotics.  Applying heat or ice to the painful area.  Steroid injections to lessen pain, irritation, and inflammation around the nerve.  Reducing activity during periods of pain.  Exercising and stretching to strengthen your abdomen and improve flexibility of your spine. Your caregiver may suggest losing weight if the extra weight makes the back pain worse.  Physical therapy.  Surgery to eliminate what is pressing or pinching the nerve, such as a bone spur or part of a herniated disk. HOME CARE INSTRUCTIONS   Only take over-the-counter or prescription medicines for pain or discomfort as directed by your caregiver.  Apply ice to the affected area for 20 minutes, 3-4 times a day for the first 48-72 hours. Then try heat in the same way.  Exercise, stretch, or perform your usual activities if these do not aggravate your pain.  Attend physical therapy sessions as directed by your caregiver.  Keep all follow-up appointments as directed by your caregiver.  Do not wear high heels or shoes that do not provide proper support.  Check your mattress to see if it  is too soft. A firm mattress may lessen your pain and discomfort. SEEK IMMEDIATE MEDICAL CARE IF:   You lose control of your bowel or bladder (incontinence).  You have increasing weakness in the lower back, pelvis, buttocks, or legs.  You have redness or swelling of your back.  You have a burning sensation when you urinate.  You have pain that gets worse when  you lie down or awakens you at night.  Your pain is worse than you have experienced in the past.  Your pain is lasting longer than 4 weeks.  You are suddenly losing weight without reason. MAKE SURE YOU:  Understand these instructions.  Will watch your condition.  Will get help right away if you are not doing well or get worse.   This information is not intended to replace advice given to you by your health care provider. Make sure you discuss any questions you have with your health care provider.   Document Released: 09/15/2001 Document Revised: 06/12/2015 Document Reviewed: 01/31/2012 Elsevier Interactive Patient Education [employer]

## 2015-12-12 ENCOUNTER — Ambulatory Visit: Payer: PRIVATE HEALTH INSURANCE | Admitting: Anesthesiology

## 2015-12-17 ENCOUNTER — Ambulatory Visit: Payer: PRIVATE HEALTH INSURANCE | Admitting: Anesthesiology

## 2016-01-08 ENCOUNTER — Ambulatory Visit: Payer: PRIVATE HEALTH INSURANCE | Admitting: Anesthesiology

## 2016-01-30 ENCOUNTER — Encounter: Payer: Self-pay | Admitting: Emergency Medicine

## 2016-01-30 DIAGNOSIS — Z792 Long term (current) use of antibiotics: Secondary | ICD-10-CM | POA: Insufficient documentation

## 2016-01-30 DIAGNOSIS — K047 Periapical abscess without sinus: Secondary | ICD-10-CM | POA: Insufficient documentation

## 2016-01-30 DIAGNOSIS — K0889 Other specified disorders of teeth and supporting structures: Secondary | ICD-10-CM | POA: Diagnosis present

## 2016-01-30 NOTE — ED Notes (Signed)
Patient ambulatory to triage with steady gait, without difficulty or distress noted; pt reports left sided jaw/dental pain since last night

## 2016-01-31 ENCOUNTER — Emergency Department
Admission: EM | Admit: 2016-01-31 | Discharge: 2016-01-31 | Disposition: A | Discharge status: Home / Self Care | Payer: Medicare Other | Attending: Emergency Medicine | Admitting: Emergency Medicine

## 2016-01-31 DIAGNOSIS — K047 Periapical abscess without sinus: Secondary | ICD-10-CM

## 2016-01-31 MED ORDER — CLINDAMYCIN HCL 300 MG PO CAPS: 300.0000 mg | ORAL_CAPSULE | Freq: Three times a day (TID) | ORAL | Status: AC

## 2016-01-31 MED ORDER — HYDROCODONE-ACETAMINOPHEN 5-325 MG PO TABS
1.0000 | ORAL_TABLET | Freq: Once | ORAL | Status: AC
  Administered 2016-01-31: 1 via ORAL
  Filled 2016-01-31: qty 1

## 2016-01-31 MED ORDER — HYDROCODONE-ACETAMINOPHEN 5-325 MG PO TABS: 1.0000 | ORAL_TABLET | ORAL | Status: DC | PRN

## 2016-01-31 MED ORDER — CLINDAMYCIN HCL 150 MG PO CAPS
300.0000 mg | ORAL_CAPSULE | Freq: Once | ORAL | Status: AC
  Administered 2016-01-31: 300 mg via ORAL
  Filled 2016-01-31: qty 2

## 2016-01-31 MED ORDER — LIDOCAINE VISCOUS 2 % MT SOLN
15.0000 mL | Freq: Once | OROMUCOSAL | Status: AC
  Administered 2016-01-31: 15 mL via OROMUCOSAL
  Filled 2016-01-31: qty 15

## 2016-01-31 NOTE — ED Notes (Signed)
Pt states L sided dental pain that began yesterday evening and has increased today. Pt states she has a plate on upper. Pain on lower L ranging from front teeth to ear. Pt states "I think I have an abscess or two." Pt [facility] and was told that she would have to fill out an application and they draw names to take clients so that is why she came here. Pt handed information sheet on dental offices in area that would [place] pts such as [address] [place], [place], [place], [facility], etc. Pt verbalized understanding.

## 2016-01-31 NOTE — ED Provider Notes (Signed)
[facility] Emergency Department Provider Note  ____________________________________________  Time seen: 12:45 AM  I have reviewed the triage vital signs and the nursing notes.   HISTORY  Chief Complaint Jaw Pain      HPI Caitlin Kelley is a 60 y.o. female presents with left-sided dental pain times one day accompanied by lower left chin swelling. Patient states she has to dental caries in that area and is concern for possible abscess. Patient denies any fever. Patient afebrile on presentation with temperature 98.2.     Past Medical History  Diagnosis Date  . GERD (gastroesophageal reflux disease)   . IBS (irritable bowel syndrome)     There are no active problems to display for this patient.   Past Surgical History  Procedure Laterality Date  . Back surgery    . Knee surgery      Current Outpatient Rx  Name  Route  Sig  Dispense  Refill  . diazepam (VALIUM) 2 MG tablet   Oral   Take 1 tablet (2 mg total) by mouth every 8 (eight) hours as needed for muscle spasms.   15 tablet   0   . HYDROcodone-acetaminophen (NORCO) 5-325 MG tablet   Oral   Take 1 tablet by mouth every 6 (six) hours as needed for moderate pain.   15 tablet   0   . phenazopyridine (PYRIDIUM) 200 MG tablet   Oral   Take 1 tablet (200 mg total) by mouth 3 (three) times daily as needed for pain.   20 tablet   0   . sulfamethoxazole-trimethoprim (BACTRIM DS,SEPTRA DS) 800-160 MG per tablet   Oral   Take 1 tablet by mouth 2 (two) times daily.   20 tablet   0     Allergies Penicillins; Amoxicillin; Lodine; Morphine and related; and Percocet  No family history on file.  Social History Social History  Substance Use Topics  . Smoking status: Never Smoker   . Smokeless tobacco: None  . Alcohol Use: No    Review of Systems  Constitutional: Negative for fever. Eyes: Negative for visual changes. ENT: Negative for sore throat. Positive for dental  pain Cardiovascular: Negative for chest pain. Respiratory: Negative for shortness of breath. Gastrointestinal: Negative for abdominal pain, vomiting and diarrhea. Genitourinary: Negative for dysuria. Musculoskeletal: Negative for back pain. Skin: Negative for rash. Neurological: Negative for headaches, focal weakness or numbness.  10-point ROS otherwise negative.  ____________________________________________   PHYSICAL EXAM:  VITAL SIGNS: ED Triage Vitals  Enc Vitals Group     BP 01/30/16 2310 174/82 mmHg     Pulse Rate 01/30/16 2310 107     Resp 01/30/16 2310 20     Temp 01/30/16 2310 98.2 F (36.8 C)     Temp src --      SpO2 01/30/16 2310 97 %     Weight 01/30/16 2310 195 lb (88.451 kg)     Height 01/30/16 2310  (1.473 m)     Head Cir --      Peak Flow --      Pain Score 01/30/16 2310 10     Pain Loc --      Pain Edu? --      Excl. in GC? --      Constitutional: Alert and oriented. Well appearing and in no distress. Eyes: Conjunctivae are normal. PERRL. Normal extraocular movements. ENT   Head: Normocephalic and atraumatic.   Nose: No congestion/rhinnorhea.   Mouth/Throat: Mucous membranes are  moist.3 dental caries noted left mandible with associated swelling of the gum.   Neck: No stridor. Hematological/Lymphatic/Immunilogical: No cervical lymphadenopathy. Cardiovascular: Normal rate, regular rhythm. Normal and symmetric distal pulses are present in all extremities. No murmurs, rubs, or gallops. Neurologic:  Normal speech and language. No gross focal neurologic deficits are appreciated. Speech is normal.  Skin:  Skin is warm, dry and intact. No rash noted. Psychiatric: Mood and affect are normal. Speech and behavior are normal. Patient exhibits appropriate insight and judgment.    INITIAL IMPRESSION / ASSESSMENT AND PLAN / ED COURSE  Pertinent labs & imaging results that were available during my care of the patient were reviewed by me and  considered in my medical decision making (see chart for details).    ____________________________________________   FINAL CLINICAL IMPRESSION(S) / ED DIAGNOSES  Final diagnoses:  Dental abscess      Darci Currentandolph N Branndon Tuite, MD 01/31/16 [phone]

## 2016-01-31 NOTE — ED Notes (Signed)
Dr. Brown at bedside

## 2016-01-31 NOTE — Discharge Instructions (Signed)

## 2016-07-27 ENCOUNTER — Emergency Department
Admission: EM | Admit: 2016-07-27 | Discharge: 2016-07-27 | Disposition: A | Discharge status: Home / Self Care | Payer: Medicare Other | Attending: Emergency Medicine | Admitting: Emergency Medicine

## 2016-07-27 ENCOUNTER — Encounter: Payer: Self-pay | Admitting: Emergency Medicine

## 2016-07-27 DIAGNOSIS — Z79899 Other long term (current) drug therapy: Secondary | ICD-10-CM | POA: Insufficient documentation

## 2016-07-27 DIAGNOSIS — L03211 Cellulitis of face: Secondary | ICD-10-CM | POA: Insufficient documentation

## 2016-07-27 DIAGNOSIS — T63441A Toxic effect of venom of bees, accidental (unintentional), initial encounter: Secondary | ICD-10-CM | POA: Diagnosis present

## 2016-07-27 MED ORDER — LORATADINE 10 MG PO TABS: 10.0000 mg | ORAL_TABLET | Freq: Every day | ORAL | 2 refills | Status: DC

## 2016-07-27 MED ORDER — SULFAMETHOXAZOLE-TRIMETHOPRIM 800-160 MG PO TABS
1.0000 | ORAL_TABLET | Freq: Once | ORAL | Status: AC
  Administered 2016-07-27: 1 via ORAL
  Filled 2016-07-27: qty 1

## 2016-07-27 MED ORDER — LORATADINE 10 MG PO TABS
10.0000 mg | ORAL_TABLET | Freq: Once | ORAL | Status: AC
  Administered 2016-07-27: 10 mg via ORAL
  Filled 2016-07-27: qty 1

## 2016-07-27 MED ORDER — SULFAMETHOXAZOLE-TRIMETHOPRIM 800-160 MG PO TABS: 1.0000 | ORAL_TABLET | Freq: Two times a day (BID) | ORAL | 0 refills | Status: AC

## 2016-07-27 NOTE — ED Triage Notes (Signed)
Pt says she was outside her neighbor's house Saturday when she was stung by a bee on the left eyelid; pt now with increasing redness and swelling around left eye; pt says the eye is uncomfortable; vision blurry;

## 2016-07-27 NOTE — ED Provider Notes (Signed)
[facility] Emergency Department Provider Note   First MD Initiated Contact with Patient 07/27/16 0037     (approximate)  I have reviewed the triage vital signs and the nursing notes.   HISTORY  Chief Complaint Eye Pain and Insect Bite    HPI Caitlin Kelley is a 60 y.o. female presents with history of being stung by a bee on her left eyelid yesterday with resultant increasing in redness swelling since event. Patient denies any fever afebrile on presentation were temperature 98.5 patient denies any change in vision.   Past Medical History:  Diagnosis Date  . GERD (gastroesophageal reflux disease)   . IBS (irritable bowel syndrome)     There are no active problems to display for this patient.   Past Surgical History:  Procedure Laterality Date  . ABDOMINAL HYSTERECTOMY    . BACK SURGERY    . CARPAL TUNNEL RELEASE    . KNEE SURGERY    . SHOULDER SURGERY      Prior to Admission medications   Medication Sig Start Date End Date Taking? Authorizing Provider  esomeprazole (NEXIUM) 40 MG capsule Take 40 mg by mouth daily at 12 noon.   Yes Historical Provider, MD    Allergies Penicillins; Amoxicillin; Lodine [etodolac]; Morphine and related; and Percocet [oxycodone-acetaminophen]  History reviewed. No pertinent family history.  Social History Social History  Substance Use Topics  . Smoking status: Never Smoker  . Smokeless tobacco: Never Used  . Alcohol use No    Review of Systems Constitutional: No fever/chills Eyes: No visual changes. ENT: No sore throat. Cardiovascular: Denies chest pain. Respiratory: Denies shortness of breath. Gastrointestinal: No abdominal pain.  No nausea, no vomiting.  No diarrhea.  No constipation. Genitourinary: Negative for dysuria. Musculoskeletal: Negative for back pain. Skin: Negative for rash. Neurological: Negative for headaches, focal weakness or numbness.  10-point ROS otherwise  negative.  ____________________________________________   PHYSICAL EXAM:  VITAL SIGNS: ED Triage Vitals [07/27/16 0016]  Enc Vitals Group     BP 138/66     Pulse Rate 78     Resp 18     Temp 98.5 F (36.9 C)     Temp Source Oral     SpO2 100 %     Weight 197 lb (89.4 kg)     Height 5' (1.524 m)     Head Circumference      Peak Flow      Pain Score 5     Pain Loc      Pain Edu?      Excl. in GC?     Constitutional: Alert and oriented. Well appearing and in no acute distress. Eyes: Conjunctivae are normal. PERRL. EOMI without pain. Left Periorbital erythema and edema noted Head: Atraumatic. Mouth/Throat: Mucous membranes are moist.  Oropharynx non-erythematous. Neck: No stridor.  No meningeal signs.   Neurologic:  Normal speech and language. No gross focal neurologic deficits are appreciated.  Skin:  Left periorbital erythema and edema noted Psychiatric: Mood and affect are normal. Speech and behavior are normal.    Procedures    INITIAL IMPRESSION / ASSESSMENT AND PLAN / ED COURSE  Pertinent labs & imaging results that were available during my care of the patient were reviewed by me and considered in my medical decision making (see chart for details).  History physical exam consistent with cellulitis secondary to a insect bite. Patient received Bactrim DS in the emergency department will be prescribed same for home in addition  patient received Claritin in the ED.   Clinical Course    ____________________________________________  FINAL CLINICAL IMPRESSION(S) / ED DIAGNOSES  Final diagnoses:  Cellulitis of face     MEDICATIONS GIVEN DURING THIS VISIT:  Medications  sulfamethoxazole-trimethoprim (BACTRIM DS,SEPTRA DS) 800-160 MG per tablet 1 tablet (not administered)  loratadine (CLARITIN) tablet 10 mg (not administered)     NEW OUTPATIENT MEDICATIONS STARTED DURING THIS VISIT:  New Prescriptions   No medications on file    Modified Medications    No medications on file    Discontinued Medications   DIAZEPAM (VALIUM) 2 MG TABLET    Take 1 tablet (2 mg total) by mouth every 8 (eight) hours as needed for muscle spasms.   HYDROCODONE-ACETAMINOPHEN (NORCO) 5-325 MG TABLET    Take 1 tablet by mouth every 6 (six) hours as needed for moderate pain.   HYDROCODONE-ACETAMINOPHEN (NORCO/VICODIN) 5-325 MG TABLET    Take 1 tablet by mouth every 4 (four) hours as needed for moderate pain.   SULFAMETHOXAZOLE-TRIMETHOPRIM (BACTRIM DS,SEPTRA DS) 800-160 MG PER TABLET    Take 1 tablet by mouth 2 (two) times daily.     Note:  This document was prepared using Dragon voice recognition software and may include unintentional dictation errors.    Darci Currentandolph N Carleena Mires, MD 07/27/16 [phone]

## 2016-12-28 ENCOUNTER — Emergency Department: Payer: Medicare Other

## 2016-12-28 ENCOUNTER — Emergency Department
Admission: EM | Admit: 2016-12-28 | Discharge: 2016-12-28 | Disposition: A | Discharge status: Home / Self Care | Payer: Medicare Other | Attending: Emergency Medicine | Admitting: Emergency Medicine

## 2016-12-28 DIAGNOSIS — R0602 Shortness of breath: Secondary | ICD-10-CM

## 2016-12-28 DIAGNOSIS — J40 Bronchitis, not specified as acute or chronic: Secondary | ICD-10-CM | POA: Diagnosis not present

## 2016-12-28 LAB — BRAIN NATRIURETIC PEPTIDE: B Natriuretic Peptide: 30 pg/mL (ref 0.0–100.0)

## 2016-12-28 LAB — CBC WITH DIFFERENTIAL/PLATELET
Basophils Absolute: 0.1 10*3/uL (ref 0–0.1)
Basophils Relative: 1 %
EOS ABS: 0.9 10*3/uL — AB (ref 0–0.7)
EOS PCT: 11 %
HCT: 41.6 % (ref 35.0–47.0)
Hemoglobin: 14 g/dL (ref 12.0–16.0)
LYMPHS ABS: 1.7 10*3/uL (ref 1.0–3.6)
Lymphocytes Relative: 19 %
MCH: 28.9 pg (ref 26.0–34.0)
MCHC: 33.7 g/dL (ref 32.0–36.0)
MCV: 85.8 fL (ref 80.0–100.0)
Monocytes Absolute: 1.1 10*3/uL — ABNORMAL HIGH (ref 0.2–0.9)
Monocytes Relative: 12 %
Neutro Abs: 5.2 10*3/uL (ref 1.4–6.5)
Neutrophils Relative %: 57 %
Platelets: 248 10*3/uL (ref 150–440)
RBC: 4.85 MIL/uL (ref 3.80–5.20)
RDW: 13.9 % (ref 11.5–14.5)
WBC: 9 10*3/uL (ref 3.6–11.0)

## 2016-12-28 LAB — BASIC METABOLIC PANEL
Anion gap: 7 (ref 5–15)
BUN: 14 mg/dL (ref 6–20)
CALCIUM: 9.1 mg/dL (ref 8.9–10.3)
CO2: 25 mmol/L (ref 22–32)
Chloride: 106 mmol/L (ref 101–111)
Creatinine, Ser: 1.16 mg/dL — ABNORMAL HIGH (ref 0.44–1.00)
GFR calc Af Amer: 58 mL/min — ABNORMAL LOW (ref >60)
GFR calc non Af Amer: 50 mL/min — ABNORMAL LOW (ref >60)
Glucose, Bld: 124 mg/dL — ABNORMAL HIGH (ref 65–99)
Potassium: 3.9 mmol/L (ref 3.5–5.1)
SODIUM: 138 mmol/L (ref 135–145)

## 2016-12-28 LAB — INFLUENZA PANEL BY PCR (TYPE A & B)
Influenza A By PCR: NEGATIVE
Influenza B By PCR: NEGATIVE

## 2016-12-28 LAB — TROPONIN I

## 2016-12-28 LAB — LACTIC ACID, PLASMA: Lactic Acid, Venous: 1 mmol/L (ref 0.5–1.9)

## 2016-12-28 MED ORDER — METHYLPREDNISOLONE SODIUM SUCC 125 MG IJ SOLR
125.0000 mg | Freq: Once | INTRAMUSCULAR | Status: AC
  Administered 2016-12-28: 125 mg via INTRAVENOUS
  Filled 2016-12-28: qty 2

## 2016-12-28 MED ORDER — PREDNISONE 20 MG PO TABS: ORAL_TABLET | ORAL | 0 refills | Status: DC

## 2016-12-28 MED ORDER — ALBUTEROL SULFATE HFA 108 (90 BASE) MCG/ACT IN AERS: 2.0000 | INHALATION_SPRAY | RESPIRATORY_TRACT | 0 refills | Status: DC | PRN

## 2016-12-28 MED ORDER — HYDROCOD POLST-CPM POLST ER 10-8 MG/5ML PO SUER
5.0000 mL | Freq: Once | ORAL | Status: AC
  Administered 2016-12-28: 5 mL via ORAL
  Filled 2016-12-28: qty 5

## 2016-12-28 MED ORDER — IPRATROPIUM-ALBUTEROL 0.5-2.5 (3) MG/3ML IN SOLN
3.0000 mL | Freq: Once | RESPIRATORY_TRACT | Status: AC
  Administered 2016-12-28: 3 mL via RESPIRATORY_TRACT
  Filled 2016-12-28: qty 3

## 2016-12-28 MED ORDER — HYDROCOD POLST-CPM POLST ER 10-8 MG/5ML PO SUER: 5.0000 mL | Freq: Two times a day (BID) | ORAL | 0 refills | Status: DC

## 2016-12-28 NOTE — Discharge Instructions (Signed)
1. Finish prednisone 60 mg daily 4 days; start her next dose Tuesday morning. 2. You may take cough medicine as needed (Tussionex). 3. Use albuterol inhaler 2 puffs every 4 hours as needed for wheezing. 4. Return to the ER for worsening symptoms, persistent vomiting, difficulty breathing or other concerns.

## 2016-12-28 NOTE — ED Provider Notes (Signed)
[facility] Emergency Department Provider Note   ____________________________________________   First MD Initiated Contact with Patient 12/28/16 0154     (approximate)  I have reviewed the triage vital signs and the nursing notes.   HISTORY  Chief Complaint Shortness of Breath    HPI Caitlin Kelley is a 61 y.o. female who presents to the ED from home with a chief complaint of shortness of breath. Reports cold type symptoms for 3 days, nonproductive cough, dyspnea on exertion, shortness of breath which worsened this evening. Denies associated fever, chills, abdominal pain, nausea, vomiting, diarrhea. Reports chest discomfort 3 days associated with coughing.Denies recent travel or trauma.   Past Medical History:  Diagnosis Date  . GERD (gastroesophageal reflux disease)   . IBS (irritable bowel syndrome)     There are no active problems to display for this patient.   Past Surgical History:  Procedure Laterality Date  . ABDOMINAL HYSTERECTOMY    . BACK SURGERY    . CARPAL TUNNEL RELEASE    . KNEE SURGERY    . SHOULDER SURGERY      Prior to Admission medications   Medication Sig Start Date End Date Taking? Authorizing Provider  albuterol (PROVENTIL HFA;VENTOLIN HFA) 108 (90 Base) MCG/ACT inhaler Inhale 2 puffs into the lungs every 4 (four) hours as needed for wheezing or shortness of breath. 12/28/16   Irean Hong, MD  chlorpheniramine-HYDROcodone (TUSSIONEX PENNKINETIC ER) 10-8 MG/5ML SUER Take 5 mLs by mouth 2 (two) times daily. 12/28/16   Irean Hong, MD  esomeprazole (NEXIUM) 40 MG capsule Take 40 mg by mouth daily at 12 noon.    Historical Provider, MD  loratadine (CLARITIN) 10 MG tablet Take 1 tablet (10 mg total) by mouth daily. 07/27/16 08/06/16  Darci Current, MD  predniSONE (DELTASONE) 20 MG tablet 3 tablets daily x 4 days 12/28/16   Irean Hong, MD    Allergies Penicillins; Amoxicillin; Lodine [etodolac]; Morphine and related; and  Percocet [oxycodone-acetaminophen]  No family history on file.  Social History Social History  Substance Use Topics  . Smoking status: Never Smoker  . Smokeless tobacco: Never Used  . Alcohol use No    Review of Systems  Constitutional: No fever/chills. Eyes: No visual changes. ENT: No sore throat. Cardiovascular: Positive for chest pain. Respiratory: Positive for cough and shortness of breath. Gastrointestinal: No abdominal pain.  No nausea, no vomiting.  No diarrhea.  No constipation. Genitourinary: Negative for dysuria. Musculoskeletal: Negative for back pain. Skin: Negative for rash. Neurological: Negative for headaches, focal weakness or numbness.  10-point ROS otherwise negative.  ____________________________________________   PHYSICAL EXAM:  VITAL SIGNS: ED Triage Vitals  Enc Vitals Group     BP 12/28/16 0111 (!) 151/85     Pulse Rate 12/28/16 0108 100     Resp 12/28/16 0108 (!) 28     Temp 12/28/16 0111 100.3 F (37.9 C)     Temp Source 12/28/16 0111 Oral     SpO2 12/28/16 0108 93 %     Weight 12/28/16 0104 200 lb (90.7 kg)     Height 12/28/16 0104 5' (1.524 m)     Head Circumference --      Peak Flow --      Pain Score --      Pain Loc --      Pain Edu? --      Excl. in GC? --     Constitutional: Alert and oriented. Well appearing and  in mild acute distress. Eyes: Conjunctivae are normal. PERRL. EOMI. Head: Atraumatic. Nose: No congestion/rhinnorhea. Mouth/Throat: Mucous membranes are moist.  Oropharynx non-erythematous. Neck: No stridor.  No carotid bruits.  Supple neck without meningismus. Cardiovascular: Normal rate, regular rhythm. Grossly normal heart sounds.  Good peripheral circulation. Respiratory: Normal respiratory effort.  No retractions. Lungs with wheezing, rhonchi and rales. Gastrointestinal: Soft and nontender. No distention. No abdominal bruits. No CVA tenderness. Musculoskeletal: No lower extremity tenderness nor edema.  No joint  effusions. Neurologic:  Normal speech and language. No gross focal neurologic deficits are appreciated. No gait instability. Skin:  Skin is warm, dry and intact. No rash noted. Psychiatric: Mood and affect are normal. Speech and behavior are normal.  ____________________________________________   LABS (all labs ordered are listed, but only abnormal results are displayed)  Labs Reviewed  CBC WITH DIFFERENTIAL/PLATELET - Abnormal; Notable for the following:       Result Value   Monocytes Absolute 1.1 (*)    Eosinophils Absolute 0.9 (*)    All other components within normal limits  BASIC METABOLIC PANEL - Abnormal; Notable for the following:    Glucose, Bld 124 (*)    Creatinine, Ser 1.16 (*)    GFR calc non Af Amer 50 (*)    GFR calc Af Amer 58 (*)    All other components within normal limits  TROPONIN I  LACTIC ACID, PLASMA  BRAIN NATRIURETIC PEPTIDE  INFLUENZA PANEL BY PCR (TYPE A & B)   ____________________________________________  EKG  ED ECG REPORT I, Shamanda Len J, the attending physician, personally viewed and interpreted this ECG.   Date: 12/28/2016  EKG Time: 0122  Rate: 96  Rhythm: normal EKG, normal sinus rhythm  Axis: Normal  Intervals:none  ST&T Change: Nonspecific  ____________________________________________  RADIOLOGY  CXR interpreted per Dr. Cherly Hensenhang: Vascular congestion. Increased interstitial markings raise concern  for mild interstitial edema.   ____________________________________________   PROCEDURES  Procedure(s) performed: None  Procedures  Critical Care performed: No  ____________________________________________   INITIAL IMPRESSION / ASSESSMENT AND PLAN / ED COURSE  Pertinent labs & imaging results that were available during my care of the patient were reviewed by me and considered in my medical decision making (see chart for details).  61 year old female who presents with cold type symptoms. Rhonchi, wheezing and Rales noted on  auscultation. Laboratory results unremarkable, x-ray negative for pneumonia. Will initiate DuoNeb, Solu-Medrol and reassess.  Clinical Course as of Dec 28 500  Mon Dec 28, 2016  0458 No wheezing. Aeration improved. Room air saturations 94%. Patient feeling much better. Will discharge home on prednisone burst, albuterol inhaler and Tussionex. She'll follow-up with her PCP this week. Strict return precautions given. Patient verbalizes understanding and agrees with plan of care.  [JS]    Clinical Course User Index [JS] Irean HongJade J Kyleena Scheirer, MD     ____________________________________________   FINAL CLINICAL IMPRESSION(S) / ED DIAGNOSES  Final diagnoses:  SOB (shortness of breath)  Bronchitis      NEW MEDICATIONS STARTED DURING THIS VISIT:  New Prescriptions   ALBUTEROL (PROVENTIL HFA;VENTOLIN HFA) 108 (90 BASE) MCG/ACT INHALER    Inhale 2 puffs into the lungs every 4 (four) hours as needed for wheezing or shortness of breath.   CHLORPHENIRAMINE-HYDROCODONE (TUSSIONEX PENNKINETIC ER) 10-8 MG/5ML SUER    Take 5 mLs by mouth 2 (two) times daily.   PREDNISONE (DELTASONE) 20 MG TABLET    3 tablets daily x 4 days     Note:  This document was  prepared using Conservation officer, historic buildings and may include unintentional dictation errors.    Irean Hong, MD 12/28/16 [phone]

## 2016-12-28 NOTE — ED Triage Notes (Signed)
Patient reports having cold symptoms for 3 days, tonight feeling short of breath.  Patient appears short of breath with exertion.

## 2016-12-28 NOTE — ED Notes (Signed)
Patient reports recurrent episodes of chest pain Thursday, Friday and Saturday night. Pt reports pain as sharp. Patient denies current chest pain

## 2016-12-28 NOTE — ED Notes (Signed)
Patient c/o cough since Thursday. Pt c/o SOB beginning Saturday night with increasing severity Sunday.

## 2018-05-18 ENCOUNTER — Other Ambulatory Visit: Payer: Self-pay | Admitting: Family Medicine

## 2018-05-18 DIAGNOSIS — Z1231 Encounter for screening mammogram for malignant neoplasm of breast: Secondary | ICD-10-CM

## 2018-07-21 ENCOUNTER — Ambulatory Visit: Payer: Self-pay | Admitting: Orthopedic Surgery

## 2018-09-30 ENCOUNTER — Encounter
Admission: RE | Admit: 2018-09-30 | Discharge: 2018-09-30 | Disposition: A | Discharge status: Home / Self Care | Payer: Medicare HMO | Source: Ambulatory Visit | Attending: Orthopedic Surgery | Admitting: Orthopedic Surgery

## 2018-09-30 ENCOUNTER — Other Ambulatory Visit: Payer: Self-pay

## 2018-09-30 DIAGNOSIS — Z01818 Encounter for other preprocedural examination: Secondary | ICD-10-CM | POA: Insufficient documentation

## 2018-09-30 HISTORY — DX: Adverse effect of unspecified anesthetic, initial encounter: T41.45XA

## 2018-09-30 HISTORY — DX: Other specified postprocedural states: R11.2

## 2018-09-30 HISTORY — DX: Personal history of other diseases of the digestive system: Z87.19

## 2018-09-30 HISTORY — DX: Other specified postprocedural states: Z98.890

## 2018-09-30 HISTORY — DX: Prediabetes: R73.03

## 2018-09-30 HISTORY — DX: Other complications of anesthesia, initial encounter: T88.59XA

## 2018-09-30 HISTORY — DX: Unspecified osteoarthritis, unspecified site: M19.90

## 2018-09-30 LAB — URINALYSIS, ROUTINE W REFLEX MICROSCOPIC
Bilirubin Urine: NEGATIVE
Glucose, UA: NEGATIVE mg/dL
HGB URINE DIPSTICK: NEGATIVE
Ketones, ur: NEGATIVE mg/dL
Leukocytes, UA: NEGATIVE
Nitrite: NEGATIVE
Protein, ur: NEGATIVE mg/dL
Specific Gravity, Urine: 1.006 (ref 1.005–1.030)
pH: 7 (ref 5.0–8.0)

## 2018-09-30 LAB — BASIC METABOLIC PANEL
Anion gap: 8 (ref 5–15)
BUN: 14 mg/dL (ref 8–23)
CO2: 25 mmol/L (ref 22–32)
Calcium: 9.3 mg/dL (ref 8.9–10.3)
Chloride: 106 mmol/L (ref 98–111)
Creatinine, Ser: 0.7 mg/dL (ref 0.44–1.00)
GFR calc Af Amer: >60 mL/min (ref >60)
GFR calc non Af Amer: >60 mL/min (ref >60)
Glucose, Bld: 107 mg/dL — ABNORMAL HIGH (ref 70–99)
Potassium: 3.9 mmol/L (ref 3.5–5.1)
SODIUM: 139 mmol/L (ref 135–145)

## 2018-09-30 LAB — CBC
HCT: 42 % (ref 36.0–46.0)
Hemoglobin: 13.8 g/dL (ref 12.0–15.0)
MCH: 29.6 pg (ref 26.0–34.0)
MCHC: 32.9 g/dL (ref 30.0–36.0)
MCV: 89.9 fL (ref 80.0–100.0)
Platelets: 311 10*3/uL (ref 150–400)
RBC: 4.67 MIL/uL (ref 3.87–5.11)
RDW: 13.1 % (ref 11.5–15.5)
WBC: 15.5 10*3/uL — ABNORMAL HIGH (ref 4.0–10.5)
nRBC: 0 % (ref 0.0–0.2)

## 2018-09-30 LAB — SURGICAL PCR SCREEN
MRSA, PCR: NEGATIVE
Staphylococcus aureus: POSITIVE — AB

## 2018-09-30 LAB — APTT: aPTT: 30 seconds (ref 24–36)

## 2018-09-30 LAB — PROTIME-INR
INR: 0.91
Prothrombin Time: 12.2 seconds (ref 11.4–15.2)

## 2018-09-30 NOTE — Patient Instructions (Signed)
  Your procedure is scheduled on: Wednesday October 12, 2018 Report to Same Day Surgery 2nd floor Medical [facility](Medical Mall Entrance-take elevator on left to 2nd floor.  Check in with surgery information desk.) To find out your arrival time, call [phone] 1:00-3:00 PM on Tuesday October 11, 2018  Remember: Instructions that are not followed completely may result in serious medical risk, up to and including death, or upon the discretion of your surgeon and anesthesiologist your surgery may need to be rescheduled.    __x__ 1. Do not eat food (including mints, candies, chewing gum) after midnight the night before your procedure. You may drink clear liquids up to 2 hours before you are scheduled to arrive at the hospital for your procedure.  Do not drink anything within 2 hours of your scheduled arrival to the hospital.  Approved clear liquids:  --Water or Apple juice without pulp  --Clear carbohydrate beverage such as Gatorade or Powerade  --Black Coffee or Clear Tea (No milk, no creamers, do not add anything to the coffee or tea)    __x__ 2. No Alcohol for 24 hours before or after surgery.   __x__ 3. No Smoking or e-cigarettes for 24 hours before surgery.  Do not use any chewable tobacco products for at least 6 hours before surgery.   __x__ 4. Notify your doctor if there is any change in your medical condition (cold, fever, infections).   __x__ 5. On the morning of surgery brush your teeth with toothpaste and water.  You may rinse your mouth with mouthwash if you wish.  Do not swallow any toothpaste or mouthwash.  Please read over the following fact sheets that you were given:   [facility] Preparing for [facility] and/or MRSA Information    __x__ Use CHG Soap or Sage wipes as directed on instruction sheet   Do not wear jewelry, make-up, hairpins, clips or nail polish on the day of surgery.  Do not wear lotions, powders, deodorant, or perfumes.   Do not shave below the face/neck 48 hours  prior to surgery.   Do not bring valuables to the hospital.    [facility] is not responsible for any belongings or valuables.               Contacts, dentures or bridgework may not be worn into surgery.  Leave your suitcase in the car. After surgery it may be brought to your room.  For patients admitted to the hospital, discharge time is determined by your treatment team.  __x__ Take these medicines the morning of surgery with a SMALL SIP OF WATER:  1. Gabapentin (Neurontin)  2. Omeprazole (Prilosec)  3. Eluxadoline (Viberzi)  ____ Follow recommendations from Cardiologist, Pulmonologist or PCP regarding stopping blood thinners such as Aspirin, Coumadin, Plavix, Eliquis, Effient, Pradaxa, and Pletal.  __x__ TODAY: Stop Anti-inflammatories such as Advil, Ibuprofen, Motrin, Meloxicam (Mobic), Aleve, Naproxen, Naprosyn, BC/Goodies powders or aspirin products. You may continue to take Tylenol and Celebrex.   ____ TODAY: Avoid herbal supplements until after surgery. You may continue to take Vitamin D, Vitamin B, and multivitamin.

## 2018-10-03 NOTE — Pre-Procedure Instructions (Signed)
WBC 15.5 FAXED TO DR Derryl HarborBOWER

## 2018-10-11 MED ORDER — CLINDAMYCIN PHOSPHATE 900 MG/50ML IV SOLN
900.0000 mg | INTRAVENOUS | Status: AC
  Administered 2018-10-12: 900 mg via INTRAVENOUS

## 2018-10-11 MED ORDER — TRANEXAMIC ACID-NACL 1000-0.7 MG/100ML-% IV SOLN
1000.0000 mg | INTRAVENOUS | Status: AC
  Administered 2018-10-12: 1000 mg via INTRAVENOUS
  Filled 2018-10-11: qty 100

## 2018-10-12 ENCOUNTER — Other Ambulatory Visit: Payer: Self-pay

## 2018-10-12 ENCOUNTER — Inpatient Hospital Stay: Payer: Medicare HMO

## 2018-10-12 ENCOUNTER — Encounter
Admission: RE | Disposition: A | Discharge status: Home Health | Payer: Self-pay | Source: Home / Self Care | Attending: Orthopedic Surgery

## 2018-10-12 ENCOUNTER — Inpatient Hospital Stay
Admission: RE | Admit: 2018-10-12 | Discharge: 2018-10-14 | DRG: 467 | Disposition: A | Discharge status: Home Health | Payer: Medicare HMO | Attending: Orthopedic Surgery | Admitting: Orthopedic Surgery

## 2018-10-12 ENCOUNTER — Inpatient Hospital Stay: Payer: Medicare HMO | Admitting: Anesthesiology

## 2018-10-12 DIAGNOSIS — T84038A Mechanical loosening of other internal prosthetic joint, initial encounter: Secondary | ICD-10-CM

## 2018-10-12 DIAGNOSIS — Y831 Surgical operation with implant of artificial internal device as the cause of abnormal reaction of the patient, or of later complication, without mention of misadventure at the time of the procedure: Secondary | ICD-10-CM | POA: Diagnosis present

## 2018-10-12 DIAGNOSIS — T84032A Mechanical loosening of internal right knee prosthetic joint, initial encounter: Secondary | ICD-10-CM | POA: Diagnosis present

## 2018-10-12 DIAGNOSIS — Z880 Allergy status to penicillin: Secondary | ICD-10-CM | POA: Diagnosis not present

## 2018-10-12 DIAGNOSIS — Z888 Allergy status to other drugs, medicaments and biological substances status: Secondary | ICD-10-CM | POA: Diagnosis not present

## 2018-10-12 DIAGNOSIS — Z90710 Acquired absence of both cervix and uterus: Secondary | ICD-10-CM

## 2018-10-12 DIAGNOSIS — K589 Irritable bowel syndrome without diarrhea: Secondary | ICD-10-CM | POA: Diagnosis present

## 2018-10-12 DIAGNOSIS — Z09 Encounter for follow-up examination after completed treatment for conditions other than malignant neoplasm: Secondary | ICD-10-CM

## 2018-10-12 DIAGNOSIS — M1711 Unilateral primary osteoarthritis, right knee: Secondary | ICD-10-CM | POA: Diagnosis present

## 2018-10-12 DIAGNOSIS — Y792 Prosthetic and other implants, materials and accessory orthopedic devices associated with adverse incidents: Secondary | ICD-10-CM | POA: Diagnosis present

## 2018-10-12 DIAGNOSIS — M25761 Osteophyte, right knee: Secondary | ICD-10-CM | POA: Diagnosis present

## 2018-10-12 DIAGNOSIS — Z881 Allergy status to other antibiotic agents status: Secondary | ICD-10-CM | POA: Diagnosis not present

## 2018-10-12 DIAGNOSIS — K449 Diaphragmatic hernia without obstruction or gangrene: Secondary | ICD-10-CM | POA: Diagnosis present

## 2018-10-12 DIAGNOSIS — Z79899 Other long term (current) drug therapy: Secondary | ICD-10-CM

## 2018-10-12 DIAGNOSIS — Z6841 Body Mass Index (BMI) 40.0 and over, adult: Secondary | ICD-10-CM | POA: Diagnosis not present

## 2018-10-12 DIAGNOSIS — Z885 Allergy status to narcotic agent status: Secondary | ICD-10-CM

## 2018-10-12 DIAGNOSIS — R7303 Prediabetes: Secondary | ICD-10-CM | POA: Diagnosis present

## 2018-10-12 DIAGNOSIS — K219 Gastro-esophageal reflux disease without esophagitis: Secondary | ICD-10-CM | POA: Diagnosis present

## 2018-10-12 DIAGNOSIS — Z96659 Presence of unspecified artificial knee joint: Secondary | ICD-10-CM

## 2018-10-12 HISTORY — PX: TOTAL KNEE REVISION: SHX996

## 2018-10-12 LAB — CBC
HEMATOCRIT: 38.9 % (ref 36.0–46.0)
Hemoglobin: 12.3 g/dL (ref 12.0–15.0)
MCH: 29.5 pg (ref 26.0–34.0)
MCHC: 31.6 g/dL (ref 30.0–36.0)
MCV: 93.3 fL (ref 80.0–100.0)
Platelets: 228 10*3/uL (ref 150–400)
RBC: 4.17 MIL/uL (ref 3.87–5.11)
RDW: 13.5 % (ref 11.5–15.5)
WBC: 20 10*3/uL — ABNORMAL HIGH (ref 4.0–10.5)
nRBC: 0 % (ref 0.0–0.2)

## 2018-10-12 LAB — TYPE AND SCREEN
ABO/RH(D): O POS
Antibody Screen: NEGATIVE

## 2018-10-12 LAB — GLUCOSE, CAPILLARY
Glucose-Capillary: 138 mg/dL — ABNORMAL HIGH (ref 70–99)
Glucose-Capillary: 104 mg/dL — ABNORMAL HIGH (ref 70–99)

## 2018-10-12 LAB — CREATININE, SERUM
Creatinine, Ser: 0.75 mg/dL (ref 0.44–1.00)
GFR calc Af Amer: >60 mL/min (ref >60)

## 2018-10-12 LAB — ABO/RH: ABO/RH(D): O POS

## 2018-10-12 SURGERY — TOTAL KNEE REVISION
Anesthesia: Spinal | Site: Knee | Laterality: Right

## 2018-10-12 MED ORDER — SODIUM CHLORIDE 0.9 % IR SOLN
Status: DC | PRN
  Administered 2018-10-12: 2000 mL

## 2018-10-12 MED ORDER — MAGNESIUM HYDROXIDE 400 MG/5ML PO SUSP
30.0000 mL | Freq: Every day | ORAL | Status: DC | PRN
  Administered 2018-10-14: 30 mL via ORAL
  Filled 2018-10-12: qty 30

## 2018-10-12 MED ORDER — BUPIVACAINE HCL (PF) 0.5 % IJ SOLN
INTRAMUSCULAR | Status: DC | PRN
  Administered 2018-10-12: 2.5 mL

## 2018-10-12 MED ORDER — FENTANYL CITRATE (PF) 100 MCG/2ML IJ SOLN
INTRAMUSCULAR | Status: DC | PRN
  Administered 2018-10-12: 50 ug via INTRAVENOUS

## 2018-10-12 MED ORDER — ROPIVACAINE HCL 5 MG/ML IJ SOLN
INTRAMUSCULAR | Status: AC
  Filled 2018-10-12: qty 30

## 2018-10-12 MED ORDER — PROPOFOL 500 MG/50ML IV EMUL
INTRAVENOUS | Status: AC
  Filled 2018-10-12: qty 50

## 2018-10-12 MED ORDER — ACETAMINOPHEN 325 MG PO TABS: 325.0000 mg | ORAL_TABLET | Freq: Four times a day (QID) | ORAL | Status: DC | PRN

## 2018-10-12 MED ORDER — BISACODYL 10 MG RE SUPP
10.0000 mg | Freq: Every day | RECTAL | Status: DC | PRN
  Administered 2018-10-14: 10 mg via RECTAL
  Filled 2018-10-12: qty 1

## 2018-10-12 MED ORDER — GLYCOPYRROLATE 0.2 MG/ML IJ SOLN
INTRAMUSCULAR | Status: AC
  Filled 2018-10-12: qty 1

## 2018-10-12 MED ORDER — BUPIVACAINE LIPOSOME 1.3 % IJ SUSP
INTRAMUSCULAR | Status: AC
  Filled 2018-10-12: qty 20

## 2018-10-12 MED ORDER — CALCIUM CARBONATE-VITAMIN D 500-200 MG-UNIT PO TABS
1.0000 | ORAL_TABLET | Freq: Every day | ORAL | Status: DC
  Administered 2018-10-13 – 2018-10-14 (×2): 1 via ORAL
  Filled 2018-10-12 (×2): qty 1

## 2018-10-12 MED ORDER — MAGNESIUM CITRATE PO SOLN
1.0000 | Freq: Once | ORAL | Status: DC | PRN
  Filled 2018-10-12 (×2): qty 296

## 2018-10-12 MED ORDER — KETOROLAC TROMETHAMINE 15 MG/ML IJ SOLN
7.5000 mg | Freq: Four times a day (QID) | INTRAMUSCULAR | Status: AC
  Administered 2018-10-12 – 2018-10-13 (×4): 7.5 mg via INTRAVENOUS
  Filled 2018-10-12 (×4): qty 1

## 2018-10-12 MED ORDER — PRAVASTATIN SODIUM 20 MG PO TABS
40.0000 mg | ORAL_TABLET | Freq: Every evening | ORAL | Status: DC
  Administered 2018-10-12 – 2018-10-13 (×2): 40 mg via ORAL
  Filled 2018-10-12 (×2): qty 2

## 2018-10-12 MED ORDER — METOCLOPRAMIDE HCL 10 MG PO TABS: 5.0000 mg | ORAL_TABLET | Freq: Three times a day (TID) | ORAL | Status: DC | PRN

## 2018-10-12 MED ORDER — MENTHOL 3 MG MT LOZG
1.0000 | LOZENGE | OROMUCOSAL | Status: DC | PRN
  Filled 2018-10-12: qty 9

## 2018-10-12 MED ORDER — LIDOCAINE HCL (PF) 1 % IJ SOLN
INTRAMUSCULAR | Status: DC | PRN
  Administered 2018-10-12: 1 mL via INTRADERMAL

## 2018-10-12 MED ORDER — GABAPENTIN 300 MG PO CAPS: 300.0000 mg | ORAL_CAPSULE | Freq: Once | ORAL | Status: DC

## 2018-10-12 MED ORDER — MIDAZOLAM HCL 2 MG/2ML IJ SOLN
INTRAMUSCULAR | Status: AC
  Filled 2018-10-12: qty 2

## 2018-10-12 MED ORDER — ROPINIROLE HCL 1 MG PO TABS
1.0000 mg | ORAL_TABLET | Freq: Every day | ORAL | Status: DC
  Administered 2018-10-12 – 2018-10-13 (×2): 1 mg via ORAL
  Filled 2018-10-12 (×2): qty 1

## 2018-10-12 MED ORDER — PREDNISONE 20 MG PO TABS
20.0000 mg | ORAL_TABLET | Freq: Two times a day (BID) | ORAL | Status: DC
  Administered 2018-10-12 – 2018-10-14 (×4): 20 mg via ORAL
  Filled 2018-10-12 (×4): qty 1

## 2018-10-12 MED ORDER — LACTATED RINGERS IV SOLN
INTRAVENOUS | Status: DC
  Administered 2018-10-12: 14:00:00 via INTRAVENOUS

## 2018-10-12 MED ORDER — FENTANYL CITRATE (PF) 100 MCG/2ML IJ SOLN
INTRAMUSCULAR | Status: AC
  Filled 2018-10-12: qty 2

## 2018-10-12 MED ORDER — BUPIVACAINE HCL (PF) 0.5 % IJ SOLN
INTRAMUSCULAR | Status: DC | PRN
  Administered 2018-10-12: 30 mL

## 2018-10-12 MED ORDER — DOCUSATE SODIUM 100 MG PO CAPS
100.0000 mg | ORAL_CAPSULE | Freq: Two times a day (BID) | ORAL | Status: DC
  Administered 2018-10-12 – 2018-10-14 (×5): 100 mg via ORAL
  Filled 2018-10-12 (×5): qty 1

## 2018-10-12 MED ORDER — ENOXAPARIN SODIUM 40 MG/0.4ML ~~LOC~~ SOLN
40.0000 mg | SUBCUTANEOUS | Status: DC
  Administered 2018-10-13 – 2018-10-14 (×2): 40 mg via SUBCUTANEOUS
  Filled 2018-10-12 (×2): qty 0.4

## 2018-10-12 MED ORDER — PHENYLEPHRINE HCL 10 MG/ML IJ SOLN
INTRAMUSCULAR | Status: AC
  Filled 2018-10-12: qty 2

## 2018-10-12 MED ORDER — ACETAMINOPHEN 500 MG PO TABS
1000.0000 mg | ORAL_TABLET | Freq: Once | ORAL | Status: AC
  Administered 2018-10-12: 1000 mg via ORAL

## 2018-10-12 MED ORDER — GABAPENTIN 300 MG PO CAPS
ORAL_CAPSULE | ORAL | Status: AC
  Filled 2018-10-12: qty 1

## 2018-10-12 MED ORDER — ROPIVACAINE HCL 5 MG/ML IJ SOLN
INTRAMUSCULAR | Status: DC | PRN
  Administered 2018-10-12: 20 mL via PERINEURAL
  Administered 2018-10-12: 10 mL via PERINEURAL

## 2018-10-12 MED ORDER — SODIUM CHLORIDE FLUSH 0.9 % IV SOLN
INTRAVENOUS | Status: AC
  Filled 2018-10-12: qty 40

## 2018-10-12 MED ORDER — ACETAMINOPHEN 500 MG PO TABS
500.0000 mg | ORAL_TABLET | Freq: Four times a day (QID) | ORAL | Status: AC
  Administered 2018-10-12 – 2018-10-13 (×4): 500 mg via ORAL
  Filled 2018-10-12 (×4): qty 1

## 2018-10-12 MED ORDER — GABAPENTIN 100 MG PO CAPS
100.0000 mg | ORAL_CAPSULE | Freq: Every day | ORAL | Status: DC
  Administered 2018-10-13 – 2018-10-14 (×2): 100 mg via ORAL
  Filled 2018-10-12 (×2): qty 1

## 2018-10-12 MED ORDER — PROPOFOL 10 MG/ML IV BOLUS
INTRAVENOUS | Status: DC | PRN
  Administered 2018-10-12 (×2): 18 mg via INTRAVENOUS

## 2018-10-12 MED ORDER — PHENOL 1.4 % MT LIQD
1.0000 | OROMUCOSAL | Status: DC | PRN
  Filled 2018-10-12: qty 177

## 2018-10-12 MED ORDER — MIDAZOLAM HCL 5 MG/5ML IJ SOLN
INTRAMUSCULAR | Status: DC | PRN
  Administered 2018-10-12: 1 mg via INTRAVENOUS

## 2018-10-12 MED ORDER — SODIUM CHLORIDE 0.9 % IV SOLN
INTRAVENOUS | Status: DC
  Administered 2018-10-12 (×2): via INTRAVENOUS

## 2018-10-12 MED ORDER — CYCLOBENZAPRINE HCL 10 MG PO TABS
5.0000 mg | ORAL_TABLET | Freq: Two times a day (BID) | ORAL | Status: DC | PRN
  Administered 2018-10-12: 5 mg via ORAL
  Filled 2018-10-12: qty 1

## 2018-10-12 MED ORDER — BUPIVACAINE HCL (PF) 0.5 % IJ SOLN
INTRAMUSCULAR | Status: AC
  Filled 2018-10-12: qty 10

## 2018-10-12 MED ORDER — LACTATED RINGERS IV SOLN: INTRAVENOUS | Status: DC

## 2018-10-12 MED ORDER — VITAMIN D 25 MCG (1000 UNIT) PO TABS
1000.0000 [IU] | ORAL_TABLET | Freq: Every day | ORAL | Status: DC
  Administered 2018-10-13 – 2018-10-14 (×2): 1000 [IU] via ORAL
  Filled 2018-10-12 (×5): qty 1

## 2018-10-12 MED ORDER — ELUXADOLINE 100 MG PO TABS: 100.0000 mg | ORAL_TABLET | Freq: Every day | ORAL | Status: DC

## 2018-10-12 MED ORDER — METOCLOPRAMIDE HCL 5 MG/ML IJ SOLN: 5.0000 mg | Freq: Three times a day (TID) | INTRAMUSCULAR | Status: DC | PRN

## 2018-10-12 MED ORDER — HYDROCODONE-ACETAMINOPHEN 7.5-325 MG PO TABS: 1.0000 | ORAL_TABLET | ORAL | Status: DC | PRN

## 2018-10-12 MED ORDER — ONDANSETRON HCL 4 MG PO TABS: 4.0000 mg | ORAL_TABLET | Freq: Four times a day (QID) | ORAL | Status: DC | PRN

## 2018-10-12 MED ORDER — LIDOCAINE HCL (PF) 2 % IJ SOLN
INTRAMUSCULAR | Status: AC
  Filled 2018-10-12: qty 10

## 2018-10-12 MED ORDER — BACITRACIN 50000 UNITS IM SOLR
INTRAMUSCULAR | Status: AC
  Filled 2018-10-12: qty 2

## 2018-10-12 MED ORDER — BUPIVACAINE-EPINEPHRINE (PF) 0.5% -1:200000 IJ SOLN
INTRAMUSCULAR | Status: AC
  Filled 2018-10-12: qty 90

## 2018-10-12 MED ORDER — ACETAMINOPHEN 10 MG/ML IV SOLN
INTRAVENOUS | Status: AC
  Filled 2018-10-12: qty 100

## 2018-10-12 MED ORDER — ENOXAPARIN SODIUM 30 MG/0.3ML ~~LOC~~ SOLN: 30.0000 mg | SUBCUTANEOUS | Status: DC

## 2018-10-12 MED ORDER — FENTANYL CITRATE (PF) 100 MCG/2ML IJ SOLN: 25.0000 ug | INTRAMUSCULAR | Status: DC | PRN

## 2018-10-12 MED ORDER — SODIUM CHLORIDE 0.9 % IV SOLN
INTRAVENOUS | Status: DC | PRN
  Administered 2018-10-12: 60 mL

## 2018-10-12 MED ORDER — LIDOCAINE HCL (CARDIAC) PF 100 MG/5ML IV SOSY
PREFILLED_SYRINGE | INTRAVENOUS | Status: DC | PRN
  Administered 2018-10-12 (×2): 100 mg via INTRAVENOUS

## 2018-10-12 MED ORDER — GABAPENTIN 300 MG PO CAPS
300.0000 mg | ORAL_CAPSULE | Freq: Every day | ORAL | Status: DC
  Administered 2018-10-12 – 2018-10-13 (×2): 300 mg via ORAL
  Filled 2018-10-12 (×3): qty 1

## 2018-10-12 MED ORDER — ONDANSETRON HCL 4 MG/2ML IJ SOLN: 4.0000 mg | Freq: Four times a day (QID) | INTRAMUSCULAR | Status: DC | PRN

## 2018-10-12 MED ORDER — ONDANSETRON HCL 4 MG/2ML IJ SOLN
4.0000 mg | Freq: Once | INTRAMUSCULAR | Status: AC
  Administered 2018-10-12: 4 mg via INTRAVENOUS

## 2018-10-12 MED ORDER — TRAMADOL HCL 50 MG PO TABS
50.0000 mg | ORAL_TABLET | Freq: Four times a day (QID) | ORAL | Status: DC
  Administered 2018-10-12 – 2018-10-14 (×8): 50 mg via ORAL
  Filled 2018-10-12 (×9): qty 1

## 2018-10-12 MED ORDER — ONDANSETRON HCL 4 MG/2ML IJ SOLN
INTRAMUSCULAR | Status: AC
  Administered 2018-10-12: 4 mg via INTRAVENOUS
  Filled 2018-10-12: qty 2

## 2018-10-12 MED ORDER — PROPOFOL 500 MG/50ML IV EMUL
INTRAVENOUS | Status: DC | PRN
  Administered 2018-10-12: 60 ug/kg/min via INTRAVENOUS

## 2018-10-12 MED ORDER — FENTANYL CITRATE (PF) 100 MCG/2ML IJ SOLN
INTRAMUSCULAR | Status: AC
  Administered 2018-10-12: 50 ug via INTRAVENOUS
  Filled 2018-10-12: qty 2

## 2018-10-12 MED ORDER — LIDOCAINE HCL (PF) 1 % IJ SOLN
INTRAMUSCULAR | Status: AC
  Filled 2018-10-12: qty 5

## 2018-10-12 MED ORDER — MIDAZOLAM HCL 2 MG/2ML IJ SOLN
2.0000 mg | Freq: Once | INTRAMUSCULAR | Status: AC
  Administered 2018-10-12: 2 mg via INTRAVENOUS

## 2018-10-12 MED ORDER — PANTOPRAZOLE SODIUM 20 MG PO TBEC
20.0000 mg | DELAYED_RELEASE_TABLET | Freq: Every day | ORAL | Status: DC
  Administered 2018-10-13 – 2018-10-14 (×2): 20 mg via ORAL
  Filled 2018-10-12 (×2): qty 1

## 2018-10-12 MED ORDER — SODIUM CHLORIDE 0.9 % IV SOLN
INTRAVENOUS | Status: DC | PRN
  Administered 2018-10-12: 30 ug/min via INTRAVENOUS

## 2018-10-12 MED ORDER — HYDROCODONE-ACETAMINOPHEN 5-325 MG PO TABS
1.0000 | ORAL_TABLET | ORAL | Status: DC | PRN
  Administered 2018-10-12 – 2018-10-13 (×3): 2 via ORAL
  Administered 2018-10-14: 1 via ORAL
  Administered 2018-10-14: 2 via ORAL
  Filled 2018-10-12: qty 2
  Filled 2018-10-12: qty 1
  Filled 2018-10-12 (×3): qty 2

## 2018-10-12 MED ORDER — CLINDAMYCIN PHOSPHATE 900 MG/50ML IV SOLN
INTRAVENOUS | Status: AC
  Filled 2018-10-12: qty 50

## 2018-10-12 MED ORDER — FENTANYL CITRATE (PF) 100 MCG/2ML IJ SOLN
50.0000 ug | Freq: Once | INTRAMUSCULAR | Status: AC
  Administered 2018-10-12: 50 ug via INTRAVENOUS

## 2018-10-12 MED ORDER — CHLORHEXIDINE GLUCONATE 4 % EX LIQD: 60.0000 mL | Freq: Once | CUTANEOUS | Status: DC

## 2018-10-12 MED ORDER — MIDAZOLAM HCL 2 MG/2ML IJ SOLN
INTRAMUSCULAR | Status: AC
  Administered 2018-10-12: 2 mg via INTRAVENOUS
  Filled 2018-10-12: qty 2

## 2018-10-12 MED ORDER — TETRACAINE HCL 1 % IJ SOLN
INTRAMUSCULAR | Status: DC | PRN
  Administered 2018-10-12: 5 mg via INTRASPINAL

## 2018-10-12 MED ORDER — CLINDAMYCIN PHOSPHATE 600 MG/50ML IV SOLN
600.0000 mg | Freq: Four times a day (QID) | INTRAVENOUS | Status: AC
  Administered 2018-10-12 (×2): 600 mg via INTRAVENOUS
  Filled 2018-10-12 (×2): qty 50

## 2018-10-12 MED ORDER — ACETAMINOPHEN 500 MG PO TABS
ORAL_TABLET | ORAL | Status: AC
  Administered 2018-10-12: 1000 mg via ORAL
  Filled 2018-10-12: qty 2

## 2018-10-12 SURGICAL SUPPLY — 63 items
ADAPTER BOLT FEMORAL +2/-2 (Knees) ×2 IMPLANT
BOWL CEMENT MIX W/ADAPTER (MISCELLANEOUS) ×2 IMPLANT
BRUSH SCRUB EZ  4% CHG (MISCELLANEOUS) ×2
BRUSH SCRUB EZ 4% CHG (MISCELLANEOUS) ×2 IMPLANT
CANISTER SUCT 1200ML W/VALVE (MISCELLANEOUS) ×3 IMPLANT
CANISTER SUCT 3000ML PPV (MISCELLANEOUS) ×6 IMPLANT
CEMENT BONE GENTAMICIN (Cement) ×6 IMPLANT
CEMENT BONE GENTAMICIN PWDR (Cement) IMPLANT
CHLORAPREP W/TINT 26ML (MISCELLANEOUS) ×6 IMPLANT
COOLER POLAR GLACIER W/PUMP (MISCELLANEOUS) ×3 IMPLANT
COVER WAND RF STERILE (DRAPES) ×1 IMPLANT
CUFF TOURN 24 STER (MISCELLANEOUS) IMPLANT
CUFF TOURN 30 STER DUAL PORT (MISCELLANEOUS) ×2 IMPLANT
DRAPE INCISE IOBAN 66X60 STRL (DRAPES) ×3 IMPLANT
DRAPE SHEET LG 3/4 BI-LAMINATE (DRAPES) ×3 IMPLANT
DRSG AQUACEL AG ADV 3.5X14 (GAUZE/BANDAGES/DRESSINGS) ×3 IMPLANT
FEMORAL ADAPTER (Orthopedic Implant) ×2 IMPLANT
FEMUR SIGMA PS SZ 2.0 R (Femur) ×2 IMPLANT
GAUZE PETRO XEROFOAM 1X8 (MISCELLANEOUS) ×3 IMPLANT
GLOVE INDICATOR 8.0 STRL GRN (GLOVE) ×3 IMPLANT
GLOVE SURG ORTHO 8.0 STRL STRW (GLOVE) ×6 IMPLANT
GOWN STRL REUS W/ TWL LRG LVL3 (GOWN DISPOSABLE) ×2 IMPLANT
GOWN STRL REUS W/ TWL XL LVL3 (GOWN DISPOSABLE) ×1 IMPLANT
GOWN STRL REUS W/TWL LRG LVL3 (GOWN DISPOSABLE) ×4
GOWN STRL REUS W/TWL XL LVL3 (GOWN DISPOSABLE) ×2
HOLDER FOLEY CATH W/STRAP (MISCELLANEOUS) ×1 IMPLANT
HOOD PEEL AWAY FLYTE STAYCOOL (MISCELLANEOUS) ×11 IMPLANT
INSERT PFC SIG STB SZ2 15.0MM (Knees) ×2 IMPLANT
IV NS 1000ML (IV SOLUTION) ×2
IV NS 1000ML BAXH (IV SOLUTION) ×1 IMPLANT
KIT TURNOVER KIT A (KITS) ×3 IMPLANT
LABEL OR SOLS (LABEL) ×3 IMPLANT
MAT ABSORB  FLUID 56X50 GRAY (MISCELLANEOUS) ×2
MAT ABSORB FLUID 56X50 GRAY (MISCELLANEOUS) ×1 IMPLANT
NDL SAFETY ECLIPSE 18X1.5 (NEEDLE) ×1 IMPLANT
NDL SPNL 20GX3.5 QUINCKE YW (NEEDLE) ×1 IMPLANT
NEEDLE HYPO 18GX1.5 SHARP (NEEDLE) ×2
NEEDLE SPNL 20GX3.5 QUINCKE YW (NEEDLE) ×3 IMPLANT
NS IRRIG 1000ML POUR BTL (IV SOLUTION) ×3 IMPLANT
OSTEOTOME THIN 10.0 1.5 (INSTRUMENTS) ×2 IMPLANT
PACK TOTAL KNEE (MISCELLANEOUS) ×3 IMPLANT
PAD DE MAYO PRESSURE PROTECT (MISCELLANEOUS) ×3 IMPLANT
PAD WRAPON POLAR KNEE (MISCELLANEOUS) ×1 IMPLANT
PATELLA DOME PFC 32MM (Knees) ×2 IMPLANT
POST AUG PFC 4MM SZ 2.6 (Knees) ×2 IMPLANT
PULSAVAC PLUS IRRIG FAN TIP (DISPOSABLE) ×3
SLEEVE MBT PROUS ML 29MM (Knees) ×2 IMPLANT
STAPLER SKIN PROX 35W (STAPLE) ×3 IMPLANT
STEM UNIVERSAL REVISION 75X10 (Stem) ×2 IMPLANT
STEM UNIVERSAL REVISION 75X12 (Stem) ×2 IMPLANT
SUCTION FRAZIER HANDLE 10FR (MISCELLANEOUS) ×2
SUCTION TUBE FRAZIER 10FR DISP (MISCELLANEOUS) ×1 IMPLANT
SUT DVC 2 QUILL PDO  T11 36X36 (SUTURE) ×2
SUT DVC 2 QUILL PDO T11 36X36 (SUTURE) ×1 IMPLANT
SUT VIC AB 2-0 CT1 18 (SUTURE) ×3 IMPLANT
SUT VIC AB 2-0 CT1 27 (SUTURE) ×2
SUT VIC AB 2-0 CT1 TAPERPNT 27 (SUTURE) ×1 IMPLANT
SUT VIC AB PLUS 45CM 1-MO-4 (SUTURE) ×3 IMPLANT
SYR 30ML LL (SYRINGE) ×6 IMPLANT
TIP FAN IRRIG PULSAVAC PLUS (DISPOSABLE) ×1 IMPLANT
TRAY FOLEY MTR SLVR 16FR STAT (SET/KITS/TRAYS/PACK) ×1 IMPLANT
TRAY TIB SZ 2 REVISION (Knees) ×2 IMPLANT
WRAPON POLAR PAD KNEE (MISCELLANEOUS) ×3

## 2018-10-12 NOTE — Anesthesia Preprocedure Evaluation (Addendum)
Anesthesia Evaluation  Patient identified by MRN, date of birth, ID band Patient awake    Reviewed: Allergy & Precautions, H&P , NPO status , Patient's Chart, lab work & pertinent test results  History of Anesthesia Complications (+) PONV and history of anesthetic complications  Airway Mallampati: III  TM Distance: <3 FB Neck ROM: limited    Dental  (+) Missing, Chipped, Poor Dentition, Upper Dentures   Pulmonary neg shortness of breath, Recent URI ,           Cardiovascular Exercise Tolerance: Good (-) angina(-) Past MI and (-) DOE negative cardio ROS       Neuro/Psych negative neurological ROS  negative psych ROS   GI/Hepatic Neg liver ROS, hiatal hernia, GERD  Medicated and Controlled,  Endo/Other  diabetes, Type 2  Renal/GU      Musculoskeletal  (+) Arthritis ,   Abdominal   Peds  Hematology negative hematology ROS (+)   Anesthesia Other Findings Past Medical History: No date: Arthritis No date: Complication of anesthesia No date: GERD (gastroesophageal reflux disease) No date: History of hiatal hernia No date: IBS (irritable bowel syndrome) No date: PONV (postoperative nausea and vomiting) No date: Pre-diabetes  Past Surgical History: No date: ABDOMINAL HYSTERECTOMY No date: BACK SURGERY No date: CARPAL TUNNEL RELEASE No date: KNEE SURGERY No date: SHOULDER SURGERY     Reproductive/Obstetrics negative OB ROS                            Anesthesia Physical Anesthesia Plan  ASA: III  Anesthesia Plan: Spinal   Post-op Pain Management:  Regional for Post-op pain   Induction:   PONV Risk Score and Plan:   Airway Management Planned: Natural Airway and Nasal Cannula  Additional Equipment:   Intra-op Plan:   Post-operative Plan:   Informed Consent: I have reviewed the patients History and Physical, chart, labs and discussed the procedure including the risks,  benefits and alternatives for the proposed anesthesia with the patient or authorized representative who has indicated his/her understanding and acceptance.   Dental Advisory Given  Plan Discussed with: Anesthesiologist, CRNA and Surgeon  Anesthesia Plan Comments: (Patient reports no bleeding problems and no anticoagulant use.  Plan for spinal with backup GA  Patient consented for risks of anesthesia including but not limited to:  - adverse reactions to medications - risk of bleeding, infection, nerve damage and headache - risk of failed spinal - damage to teeth, lips or other oral mucosa - sore throat or hoarseness - Damage to heart, brain, lungs or loss of life  Patient voiced understanding.)       Anesthesia Quick Evaluation

## 2018-10-12 NOTE — H&P (Signed)
The patient has been re-examined, and the chart reviewed, and there have been no interval changes to the documented history and physical.  Plan a right total knee revision today.  Anesthesia is consulted regarding a peripheral nerve block for post-operative pain.  The risks, benefits, and alternatives have been discussed at length, and the patient is willing to proceed.     

## 2018-10-12 NOTE — Anesthesia Procedure Notes (Signed)
Spinal  Patient location during procedure: OR Start time: 10/12/2018 7:42 AM End time: 10/12/2018 7:51 AM Staffing Resident/CRNA: Bernardo Heater, CRNA Performed: resident/CRNA  Preanesthetic Checklist Completed: patient identified, site marked, surgical consent, pre-op evaluation, timeout performed, IV checked, risks and benefits discussed and monitors and equipment checked Spinal Block Patient position: sitting Prep: ChloraPrep Patient monitoring: heart rate, continuous pulse ox, blood pressure and cardiac monitor Approach: midline Location: L3-4 Injection technique: single-shot Needle Needle type: Introducer and Pencan  Needle gauge: 24 G Needle length: 9 cm Additional Notes Negative paresthesia. Negative blood return. Positive free-flowing CSF. Expiration date of kit checked and confirmed. Patient tolerated procedure well, without complications.

## 2018-10-12 NOTE — Anesthesia Post-op Follow-up Note (Signed)
Anesthesia QCDR form completed.        

## 2018-10-12 NOTE — OR Nursing (Signed)
Dr Odis Luster and anesthesia aware of 99.8 temp this morning. No new orders.

## 2018-10-12 NOTE — Progress Notes (Signed)
PHARMACIST - PHYSICIAN COMMUNICATION  CONCERNING:  Enoxaparin (Lovenox) for DVT Prophylaxis    RECOMMENDATION: Patient was prescribed enoxaprin 40mg  q24 hours for VTE prophylaxis.   Filed Weights   10/12/18 1240  Weight: 197 lb 5 oz (89.5 kg)    Body mass index is 41.24 kg/m.  Estimated Creatinine Clearance: 68.5 mL/min (by C-G formula based on SCr of 0.75 mg/dL).   Patient is candidate for enoxaparin 40mg  every 24 hours based on CrCl >5030ml/min and Weight > 45kg for female   DESCRIPTION: Pharmacy [facility].  Patient is now receiving enoxaparin 40mg  every 24 hours.     Albina Billetharles M Deandra Goering, PharmD, BCPS Clinical Pharmacist 10/12/2018 2:31 PM

## 2018-10-12 NOTE — Transfer of Care (Signed)
Immediate Anesthesia Transfer of Care Note  Patient: Caitlin Kelley  Procedure(s) Performed: TOTAL KNEE REVISION (Right Knee)  Patient Location: PACU  Anesthesia Type:Spinal  Level of Consciousness: awake, alert , oriented and patient cooperative  Airway & Oxygen Therapy: Patient Spontanous Breathing and Patient connected to nasal cannula oxygen  Post-op Assessment: Report given to RN and Post -op Vital signs reviewed and stable  Post vital signs: Reviewed and stable  Last Vitals:  Vitals Value Taken Time  BP    Temp    Pulse 100 10/12/2018 10:51 AM  Resp 24 10/12/2018 10:51 AM  SpO2 98 % 10/12/2018 10:51 AM  Vitals shown include unvalidated device data.  Last Pain:  Vitals:   10/12/18 0609  TempSrc: Temporal  PainSc: 5          Complications: No apparent anesthesia complications

## 2018-10-12 NOTE — OR Nursing (Signed)
Incentive spirometer instructions given with return demonstration. 

## 2018-10-12 NOTE — Evaluation (Signed)
Physical Therapy Evaluation Patient Details Name: Caitlin Kelley MRN: [id number] DOB: [birth year] Today's Date: 10/12/2018   History of Present Illness  Pt is a 63 yo female s/p R total knee arthroplasty revision.  PMH includes: GERD, hiatal hernia, DM II, and back surgery.    Clinical Impression  Pt presents with deficits in strength, transfers, mobility, gait, balance, R knee ROM, and activity tolerance but overall performed well especially considering POD #0 status.  Pt motivated with great effort during the session.  Pt was able to perform sup to/from sit with only extra time and effort but without physical assistance and stood from the EOB with good control and stability with CGA.  Pt was able to amb around 8' near the EOB initially with difficulty advancing the LLE during RLE stance phase.  After several steps the pt was able to lift her L foot from the floor without difficulty.  I anticipate the pt to progress very well in acute care and the pt will benefit from HHPT services upon discharge to safely address above deficits for decreased caregiver assistance and eventual return to PLOF.      Follow Up Recommendations Home health PT    Equipment Recommendations  None recommended by PT    Recommendations for Other Services       Precautions / Restrictions Precautions Precautions: Fall Restrictions Weight Bearing Restrictions: Yes RLE Weight Bearing: Weight bearing as tolerated      Mobility  Bed Mobility Overal bed mobility: Modified Independent             General bed mobility comments: Extra time and effort during sup to/from sit but no physical assistance needed  Transfers Overall transfer level: Needs assistance Equipment used: Rolling walker (2 wheeled) Transfers: Sit to/from Stand Sit to Stand: Min guard         General transfer comment: Good control and stability with transfers with min verbal cues for sequencing.   Ambulation/Gait   Gait Distance  (Feet): 8 Feet Assistive device: Rolling walker (2 wheeled) Gait Pattern/deviations: Step-to pattern;Antalgic;Decreased stance time - right Gait velocity: decreased   General Gait Details: Pt moderately antalgic on the RLE during her initial steps with difficulty lifting the L foot from the floor but improved quickly after several steps   Stairs            Wheelchair Mobility    Modified Rankin (Stroke Patients Only)       Balance Overall balance assessment: Mild deficits observed, not formally tested                                           Pertinent Vitals/Pain Pain Assessment: 0-10 Pain Score: 6  Pain Location: R knee Pain Descriptors / Indicators: Aching;Sore Pain Intervention(s): Monitored during session;Premedicated before session    Home Living Family/patient expects to be discharged to:: Private residence Living Arrangements: Children;Other relatives Available Help at Discharge: Family;Available 24 hours/day Type of Home: House Home Access: Stairs to enter Entrance Stairs-Rails: Right Entrance Stairs-Number of Steps: 3 Home Layout: One level Home Equipment: Walker - 2 wheels      Prior Function Level of Independence: Independent         Comments: Ind amb community distances without an AD, 2 falls in the last hear secondary to R knee buckling, Ind with all ADLs     Hand Dominance  Extremity/Trunk Assessment   Upper Extremity Assessment Upper Extremity Assessment: Overall WFL for tasks assessed    Lower Extremity Assessment Lower Extremity Assessment: Generalized weakness;RLE deficits/detail RLE Deficits / Details: Pt able to perform Ind RLE SLRs without extensor lag RLE: Unable to fully assess due to pain RLE Sensation: WNL       Communication   Communication: No difficulties  Cognition Arousal/Alertness: Awake/alert Behavior During Therapy: WFL for tasks assessed/performed Overall Cognitive Status: Within  Functional Limits for tasks assessed                                        General Comments      Exercises Total Joint Exercises Ankle Circles/Pumps: AROM;Both;10 reps Quad Sets: Strengthening;Right;AROM;10 reps Gluteal Sets: Strengthening;Both;10 reps Hip ABduction/ADduction: AROM;Both;10 reps Straight Leg Raises: AROM;AAROM;Both;10 reps Long Arc Quad: AROM;Right;10 reps;15 reps Knee Flexion: AROM;Right;10 reps;15 reps Goniometric ROM: R knee AROM: 6-58 deg Marching in Standing: AROM;Both;10 reps Other Exercises Other Exercises: RLE positioning education with pt and family to encourage R knee ext PROM   Assessment/Plan    PT Assessment Patient needs continued PT services  PT Problem List Decreased strength;Decreased range of motion;Decreased activity tolerance;Decreased balance;Decreased mobility;Decreased knowledge of use of DME       PT Treatment Interventions DME instruction;Gait training;Stair training;Functional mobility training;Therapeutic activities;Therapeutic exercise;Balance training;Patient/family education    PT Goals (Current goals can be found in the Care Plan section)  Acute Rehab PT Goals Patient Stated Goal: To walk more PT Goal Formulation: With patient Time For Goal Achievement: 10/25/18 Potential to Achieve Goals: Good    Frequency BID   Barriers to discharge        Co-evaluation               AM-PAC PT "6 Clicks" Mobility  Outcome Measure Help needed turning from your back to your side while in a flat bed without using bedrails?: None Help needed moving from lying on your back to sitting on the side of a flat bed without using bedrails?: None Help needed moving to and from a bed to a chair (including a wheelchair)?: A Little Help needed standing up from a chair using your arms (e.g., wheelchair or bedside chair)?: A Little Help needed to walk in hospital room?: A Little Help needed climbing 3-5 steps with a railing? : A  Little 6 Click Score: 20    End of Session Equipment Utilized During Treatment: Gait belt Activity Tolerance: Patient tolerated treatment well;No increased pain Patient left: in bed;with bed alarm set;with call bell/phone within reach;with family/visitor present;with SCD's reapplied;Other (comment)(Pt declined up in chair; polar care donned to R knee) Nurse Communication: Mobility status PT Visit Diagnosis: Other abnormalities of gait and mobility (R26.89);Muscle weakness (generalized) (M62.81)    Time: [id number] PT Time Calculation (min) (ACUTE ONLY): 40 min   Charges:   PT Evaluation $PT Eval Low Complexity: 1 Low PT Treatments $Therapeutic Exercise: 8-22 mins        D. Elly ModenaScott Mckenzi Buonomo PT, DPT 10/12/18, 5:23 PM

## 2018-10-12 NOTE — Anesthesia Procedure Notes (Signed)
Anesthesia Regional Block: Adductor canal block   Pre-Anesthetic Checklist: ,, timeout performed, Correct Patient, Correct Site, Correct Laterality, Correct Procedure, Correct Position, site marked, Risks and benefits discussed,  Surgical consent,  Pre-op evaluation,  At surgeon's request and post-op pain management  Laterality: Lower and Right  Prep: chloraprep       Needles:  Injection technique: Single-shot  Needle Type: Echogenic Needle     Needle Length: 9cm  Needle Gauge: 21     Additional Needles:   Procedures:,,,, ultrasound used (permanent image in chart),,,,  Narrative:  Start time: 10/12/2018 7:15 AM End time: 10/12/2018 7:17 AM Injection made incrementally with aspirations every 5 mL.  Performed by: Personally  Anesthesiologist: Ransom Nickson, Cleda Mccreedy, MD  Additional Notes: Patient consented for risk and benefits of nerve block including but not limited to nerve damage, failed block, bleeding and infection.  Patient voiced understanding.  Functioning IV was confirmed and monitors were applied.  A echogenic needle was used. Sterile prep,hand hygiene and sterile gloves were used. Minimal sedation used for procedure.   No paresthesia endorsed by patient during the procedure.  Negative aspiration and negative test dose prior to incremental administration of local anesthetic. The patient tolerated the procedure well with no immediate complications.

## 2018-10-12 NOTE — Op Note (Signed)
DATE OF SURGERY:  10/12/2018 TIME: 10:45 AM  PATIENT NAME:  Caitlin Kelley   AGE: 63 y.o.    PRE-OPERATIVE DIAGNOSIS:  Failed right knee arthroplasty  POST-OPERATIVE DIAGNOSIS:  Same  PROCEDURE:  Procedure(s): TOTAL KNEE REVISION, Right  SURGEON:  Lyndle Herrlich, MD   ASSISTANT:  Altamese Cabal, PA-C  OPERATIVE IMPLANTS: J&J size 2 tibia with 29 mm sleeve and 75 mm x 12 mm stem, size 2 PS Femur with 4 mm posterior augment, 75 mm x 10 mm stem, -2 mm adaptor, size 2 15 mm RP stabilized insert,  32 mm oval dome patella   PREOPERATIVE INDICATIONS:  Caitlin Kelley is an 63 y.o. 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    The risks, benefits, and alternatives were discussed at length including but not limited to the risks of infection, bleeding, nerve or blood vessel injury, knee stiffness, fracture, dislocation, loosening or failure of the hardware and the need for further surgery. Medical risks include but not limited to DVT and pulmonary embolism, myocardial infarction, stroke, pneumonia, respiratory failure and death. I discussed these risks with the patient in my office prior to the date of surgery. They understood these risks and were willing to proceed.  OPERATIVE FINDINGS AND UNIQUE ASPECTS OF THE CASE:  Lateral and patellofemoral advanced and severe degenerative changes, large osteophytes and an abundance of synovial fluid.  Loosening, metalosis and wear of the medial compartment. Significant deformity was also noted. A decision was made to proceed with total knee arthroplasty.   OPERATIVE DESCRIPTION:  The patient was brought to the operative room and placed in a supine position after  undergoing placement of a general anesthetic. IV antibiotics were given. Patient received tranexamic acid. The lower extremity was prepped and draped in the usual sterile fashion.  A time out was performed to verify the patient's name, date of birth, medical record number, correct site of surgery and correct procedure to be performed. The timeout was also used to confirm the patient received antibiotics and that appropriate instruments, implants and radiographs studies were available in the room.  The leg was elevated and exsanguinated with an Esmarch and the tourniquet was inflated to 275 mmHg.  A midline incision was made over the left knee. The prior incision was excised. A medial parapatellar arthrotomy was then made and the patella subluxed laterally and the knee was brought into 90 of flexion. Hoffa's fat pad along with the posterior cruciate ligament was resected and the medial joint line was exposed. The tibial and femoral component were removed without the use of osteotomes.  Attention was then turned to preparation of the patella. The thickness of the patella was measured with a caliper, the diameter measured with the patella templates.  The patella resection was then made with an oscillating saw using the patella cutting guide.  The 32 mm button fit appropriately.  3 peg holes for the patella component were then drilled.  The extramedullary tibial cutting guide was then placed using the anterior tibial crest and second ray of the foot as a reference.  The tibial cutting guide was adjusted to allow for appropriate posterior slope.  The tibial cutting block was pinned into position. The slotted stylus was used to measure the proximal tibial resection of 10  mm off the high lateral side. Care was taken during the tibial resection to protect the medial and collateral ligaments.  The resected tibial bone was removed. Sclerotic bone and old cement was debrided from the medial side with osteotomes and  curettes. The 29 mm broach was seated with excellent bone purchase. The size 2 tibial tray fit appropriately.   The distal femur was resected using the intramedullary guide.  Care was taken to protect the collateral ligaments during distal femoral resection.  The distal femoral resection was performed with an oscillating saw. The femur was sized to be a 2. Rotation of the referencing guide was checked with the epicondylar axis and Whitesides line. Then the 4-in-1 cutting jig was then applied to the distal femur. An additional 4 mm were resectome from the posterior medial condyle.Then the anterior, posterior and chamfer femoral cuts were then made with an oscillating saw.  The knee was distracted and all posterior osteophytes were removed.  The flexion gap was then measured with a flexion spacer block and long alignment rod and was found to be symmetric with the extension gap and perpendicular to mechanical axis of the tibia.  The proximal tibia was then prepared with the keel punch.  After tibial preparation was completed, all trial components were inserted with polyethylene trials, including stems and sleeve. The knee achieved full extension and flexed to 110 degrees. Ligament were stable to varus and valgus at full extension as well as 30, 60 and 90 degrees of flexion.   All trial components were then removed.  The joint was copiously irrigated with pulse lavage.  The final total knee arthroplasty components were then cemented into place. The knee was held in extension while cement was allowed to cure.The knee was taken through a range of motion and the patella tracked well and the knee was again irrigated copiously.  The knee capsule was then injected with Exparel.  The medial arthrotomy was closed with #1 Vicryl and #2 Quill. The subcutaneous tissue closed with  2-0 vicryl, and skin approximated with staples.  A dry sterile and compressive dressing was applied.  A Polar Care was applied to the  operative knee.  The patient was awakened and brought to the PACU in stable and satisfactory condition.  All sharp, lap and instrument counts were correct at the conclusion the case. I spoke with the patient's family in the postop consultation room to let them know the case had been performed without complication and the patient was stable in recovery room.   Total tourniquet time was 94 minutes.

## 2018-10-12 NOTE — Progress Notes (Signed)
Chaplain responded to an OR for an AD. Chaplain educated Pt and oldest daughter(Kim)on an AD. Chaplain offered prayer and executed prayer for good decision making.    10/12/18 1400  Clinical Encounter Type  Visited With Patient and family together  Visit Type Initial  Referral From Nurse  Spiritual Encounters  Spiritual Needs Brochure;Prayer

## 2018-10-13 LAB — BASIC METABOLIC PANEL
Anion gap: 6 (ref 5–15)
BUN: 11 mg/dL (ref 8–23)
CO2: 26 mmol/L (ref 22–32)
CREATININE: 0.79 mg/dL (ref 0.44–1.00)
Calcium: 8.4 mg/dL — ABNORMAL LOW (ref 8.9–10.3)
Chloride: 108 mmol/L (ref 98–111)
GFR calc Af Amer: >60 mL/min (ref >60)
GFR calc non Af Amer: >60 mL/min (ref >60)
Glucose, Bld: 145 mg/dL — ABNORMAL HIGH (ref 70–99)
Potassium: 4.1 mmol/L (ref 3.5–5.1)
Sodium: 140 mmol/L (ref 135–145)

## 2018-10-13 LAB — CBC
HEMATOCRIT: 38 % (ref 36.0–46.0)
Hemoglobin: 12 g/dL (ref 12.0–15.0)
MCH: 29.2 pg (ref 26.0–34.0)
MCHC: 31.6 g/dL (ref 30.0–36.0)
MCV: 92.5 fL (ref 80.0–100.0)
Platelets: 231 10*3/uL (ref 150–400)
RBC: 4.11 MIL/uL (ref 3.87–5.11)
RDW: 13.4 % (ref 11.5–15.5)
WBC: 17 10*3/uL — ABNORMAL HIGH (ref 4.0–10.5)
nRBC: 0 % (ref 0.0–0.2)

## 2018-10-13 NOTE — Progress Notes (Signed)
Clinical Social Worker (CSW) received SNF consult. PT is recommending home health. RN case manager aware of above. Please reconsult if future social work needs arise. CSW signing off.   Willey Due, LCSW [phone] 

## 2018-10-13 NOTE — Progress Notes (Signed)
Subjective:  Patient reports pain as moderate.    Objective:   VITALS:   Vitals:   10/12/18 2004 10/12/18 2325 10/13/18 0418 10/13/18 0740  BP: 122/71 110/69 114/72 123/70  Pulse: (!) 102 96 82 84  Resp: 15 14 14    Temp: 99.2 F (37.3 C) 98.8 F (37.1 C) 98.2 F (36.8 C) 98.3 F (36.8 C)  TempSrc: Oral Oral    SpO2: 96% 95% 97% 99%  Weight:      Height:        PHYSICAL EXAM:  Sensation intact distally Intact pulses distally Dorsiflexion/Plantar flexion intact Incision: dressing C/D/I Compartment soft  LABS  Results for orders placed or performed during the hospital encounter of 10/12/18 (from the past 24 hour(s))  Glucose, capillary     Status: Abnormal   Collection Time: 10/12/18 10:57 AM  Result Value Ref Range   Glucose-Capillary 104 (H) 70 - 99 mg/dL  CBC     Status: Abnormal   Collection Time: 10/12/18 12:57 PM  Result Value Ref Range   WBC 20.0 (H) 4.0 - 10.5 K/uL   RBC 4.17 3.87 - 5.11 MIL/uL   Hemoglobin 12.3 12.0 - 15.0 g/dL   HCT 64.3 32.9 - 51.8 %   MCV 93.3 80.0 - 100.0 fL   MCH 29.5 26.0 - 34.0 pg   MCHC 31.6 30.0 - 36.0 g/dL   RDW 84.1 66.0 - 63.0 %   Platelets 228 150 - 400 K/uL   nRBC 0.0 0.0 - 0.2 %  Creatinine, serum     Status: None   Collection Time: 10/12/18 12:57 PM  Result Value Ref Range   Creatinine, Ser 0.75 0.44 - 1.00 mg/dL   GFR calc non Af Amer >60 >60 mL/min   GFR calc Af Amer >60 >60 mL/min  Basic metabolic panel     Status: Abnormal   Collection Time: 10/13/18  3:03 AM  Result Value Ref Range   Sodium 140 135 - 145 mmol/L   Potassium 4.1 3.5 - 5.1 mmol/L   Chloride 108 98 - 111 mmol/L   CO2 26 22 - 32 mmol/L   Glucose, Bld 145 (H) 70 - 99 mg/dL   BUN 11 8 - 23 mg/dL   Creatinine, Ser 1.60 0.44 - 1.00 mg/dL   Calcium 8.4 (L) 8.9 - 10.3 mg/dL   GFR calc non Af Amer >60 >60 mL/min   GFR calc Af Amer >60 >60 mL/min   Anion gap 6 5 - 15  CBC     Status: Abnormal   Collection Time: 10/13/18  3:03 AM  Result Value  Ref Range   WBC 17.0 (H) 4.0 - 10.5 K/uL   RBC 4.11 3.87 - 5.11 MIL/uL   Hemoglobin 12.0 12.0 - 15.0 g/dL   HCT 10.9 32.3 - 55.7 %   MCV 92.5 80.0 - 100.0 fL   MCH 29.2 26.0 - 34.0 pg   MCHC 31.6 30.0 - 36.0 g/dL   RDW 32.2 02.5 - 42.7 %   Platelets 231 150 - 400 K/uL   nRBC 0.0 0.0 - 0.2 %    Dg Knee Right Port  Result Date: 10/12/2018 CLINICAL DATA:  Status post total knee replacement EXAM: PORTABLE RIGHT KNEE - 1-2 VIEW COMPARISON:  05/15/2009 FINDINGS: Changes of total right knee replacement. Soft tissue and joint space gas noted. No hardware bony complicating feature. IMPRESSION: Right knee replacement.  No visible complicating feature. Electronically Signed   By: Charlett Nose M.D.   On: 10/12/2018 11:11  Assessment/Plan: 1 Day Post-Op   Active Problems:   Loosening of knee joint prosthesis (HCC)   Up with therapy Plan for discharge tomorrow   Lyndle Herrlich , MD 10/13/2018, 8:59 AM

## 2018-10-13 NOTE — Care Management Note (Signed)
Case Management Note  Patient Details  Name: Caitlin Kelley MRN: [id number] Date of Birth: [birth year]  Subjective/Objective:                    Action/Plan: Met with Patient to discuss discharge needs Patient states she can not go to Outpatient Physical Therapy and wants to have Home health Provided patient with [facility] choice and she chooses [facility], I notified [facility] and [place] agrees to accept the patient Spoke with Dr. Harlow Mares at [phone] and let him know that the patient wants to use Home Health and that PT recommends Home health PT as well, let him know that need a Order at [place] to  have Home health PT.   Patient states that she has transportation  Patient states that she has a rolling walker and a bedside commode in place at home already  Expected Discharge Date:                  Expected Discharge Plan:     In-House Referral:     Discharge planning Services     Post Acute Care Choice:    Choice offered to:     DME Arranged:    DME Agency:     HH Arranged:  PT [place] Agency:  [facility]  Status of Service:  In process, will continue to follow  If discussed at Long Length of Stay Meetings, dates discussed:    Additional Comments:  Su Hilt, RN 10/13/2018, 10:12 AM

## 2018-10-13 NOTE — Anesthesia Postprocedure Evaluation (Signed)
Anesthesia Post Note  Patient: Caitlin SacramentoSylvia K Kelley  Procedure(s) Performed: TOTAL KNEE REVISION (Right Knee)  Patient location during evaluation: Nursing Unit Anesthesia Type: Spinal Level of consciousness: awake, awake and alert and oriented Pain management: pain level controlled Vital Signs Assessment: post-procedure vital signs reviewed and stable Respiratory status: spontaneous breathing, nonlabored ventilation and respiratory function stable Cardiovascular status: blood pressure returned to baseline and stable Postop Assessment: no headache and no backache     Last Vitals:  Vitals:   10/13/18 0740 10/13/18 1222  BP: 123/70 (!) 155/66  Pulse: 84 88  Resp:    Temp: 36.8 C 36.8 C  SpO2: 99% 96%    Last Pain:  Vitals:   10/13/18 1255  TempSrc:   PainSc: 6                  Masco CorporationStephanie Jereline Ticer

## 2018-10-13 NOTE — Progress Notes (Signed)
Physical Therapy Treatment Patient Details Name: Caitlin Kelley MRN: [id number] DOB: 09-02-1956 Today's Date: 10/13/2018    History of Present Illness Pt is a 63 yo female s/p R total knee arthroplasty revision.  PMH includes: GERD, hiatal hernia, DM II, and back surgery.    PT Comments    Pt presents with deficits in strength, transfers, mobility, gait, R knee ROM, and activity tolerance but is making very good progress towards goals.  The pt continues to require no physical assistance with bed mobility tasks or transfers and is performing both with increased speed and confidence.  Pt was able to amb 2 x 125' with a RW with improving gait quality and with good stability.  Pt was steady ascending and descending steps with B rails but did require mod verbal and tactile cues to ensure proper sequencing with pt somewhat impulsive initially attempting to step up with her RLE.  Pt will benefit from HHPT services upon discharge to safely address above deficits for decreased caregiver assistance and eventual return to PLOF.     Follow Up Recommendations  Home health PT     Equipment Recommendations  None recommended by PT    Recommendations for Other Services       Precautions / Restrictions Precautions Precautions: Fall;Knee Restrictions Weight Bearing Restrictions: Yes RLE Weight Bearing: Weight bearing as tolerated    Mobility  Bed Mobility Overal bed mobility: Modified Independent             General bed mobility comments: Extra time and effort during sup to/from sit but no physical assistance needed  Transfers Overall transfer level: Needs assistance Equipment used: Rolling walker (2 wheeled) Transfers: Sit to/from Stand Sit to Stand: Supervision         General transfer comment: Good control and stability with transfers with min verbal cues for sequencing.   Ambulation/Gait Ambulation/Gait assistance: Supervision;Min guard Gait Distance (Feet): 125 Feet x  2 Assistive device: Rolling walker (2 wheeled) Gait Pattern/deviations: Step-through pattern;Antalgic;Decreased step length - left Gait velocity: decreased   General Gait Details: Minimally antalgic gait pattern on the RLE with grossly improved cadence and RLE stance time compared to previous session   Stairs Stairs: Yes Stairs assistance: Min guard Stair Management: Two rails Number of Stairs: 3 General stair comments: Mod verbal/tactile cues for sequencing pt able to demonstrate good eccentric and concentric control   Wheelchair Mobility    Modified Rankin (Stroke Patients Only)       Balance Overall balance assessment: No apparent balance deficits (not formally assessed)                                          Cognition Arousal/Alertness: Awake/alert Behavior During Therapy: WFL for tasks assessed/performed Overall Cognitive Status: Within Functional Limits for tasks assessed                                        Exercises Total Joint Exercises Ankle Circles/Pumps: AROM;Both;10 reps;15 reps Quad Sets: Strengthening;Right;AROM;10 reps;15 reps Gluteal Sets: Strengthening;Both;10 reps Hip ABduction/ADduction: AROM;AAROM;Both;10 reps Straight Leg Raises: AROM;AAROM;Both;10 reps Long Arc Quad: AROM;Right;10 reps;15 reps Knee Flexion: AROM;Right;10 reps;15 reps Marching in Standing: AROM;Both;10 reps Other Exercises Other Exercises: HEP education/review per handout Other Exercises: Positioning education/review to encourage R knee ext PROM  Other Exercises: 90  deg right turn training to prevent CKC twisting on the R knee    General Comments        Pertinent Vitals/Pain Pain Assessment: 0-10 Pain Score: 8  Pain Location: R knee Pain Descriptors / Indicators: Aching;Sore Pain Intervention(s): Premedicated before session;Monitored during session    Home Living                      Prior Function            PT  Goals (current goals can now be found in the care plan section) Progress towards PT goals: Progressing toward goals    Frequency    BID      PT Plan Current plan remains appropriate    Co-evaluation              AM-PAC PT "6 Clicks" Mobility   Outcome Measure  Help needed turning from your back to your side while in a flat bed without using bedrails?: None Help needed moving from lying on your back to sitting on the side of a flat bed without using bedrails?: None Help needed moving to and from a bed to a chair (including a wheelchair)?: A Little Help needed standing up from a chair using your arms (e.g., wheelchair or bedside chair)?: A Little Help needed to walk in hospital room?: A Little Help needed climbing 3-5 steps with a railing? : A Little 6 Click Score: 20    End of Session Equipment Utilized During Treatment: Gait belt Activity Tolerance: Patient tolerated treatment well;No increased pain Patient left: in bed;with call bell/phone within reach;with bed alarm set;with family/visitor present;with SCD's reapplied;Other (comment)(Polar care to R knee) Nurse Communication: Mobility status PT Visit Diagnosis: Other abnormalities of gait and mobility (R26.89);Muscle weakness (generalized) (M62.81)     Time: 4076-8088 PT Time Calculation (min) (ACUTE ONLY): 41 min  Charges:  $Gait Training: 23-37 mins $Therapeutic Exercise: 8-22 mins                     D. Scott Marthann Abshier PT, DPT 10/13/18, 4:24 PM

## 2018-10-13 NOTE — Progress Notes (Signed)
Physical Therapy Treatment Patient Details Name: Caitlin Kelley MRN: [id number] DOB: 07-25-1956 Today's Date: 10/13/2018    History of Present Illness Pt is a 63 yo female s/p R total knee arthroplasty revision.  PMH includes: GERD, hiatal hernia, DM II, and back surgery.    PT Comments    Pt presents with deficits in strength, transfers, mobility, gait, R knee ROM, and activity tolerance but overall is progressing well towards goals.  Pt required no physical assistance with bed mobility tasks and was steady with good eccentric and concentric control during transfers.  Pt increased amb distance to around 100' this session with decreased pain to the R knee with weight bearing as session progressed.  Pt remains highly motivated to participate with rehab and showed great effort during the session.  Pt will benefit from HHPT services upon discharge to safely address above deficits for decreased caregiver assistance and eventual return to PLOF.     Follow Up Recommendations  Home health PT     Equipment Recommendations  None recommended by PT    Recommendations for Other Services       Precautions / Restrictions Precautions Precautions: Fall Restrictions Weight Bearing Restrictions: Yes RLE Weight Bearing: Weight bearing as tolerated    Mobility  Bed Mobility Overal bed mobility: Modified Independent             General bed mobility comments: Extra time and effort during sup to/from sit but no physical assistance needed  Transfers Overall transfer level: Needs assistance Equipment used: Rolling walker (2 wheeled) Transfers: Sit to/from Stand Sit to Stand: Supervision         General transfer comment: Good control and stability with transfers with min verbal cues for sequencing.   Ambulation/Gait   Gait Distance (Feet): 100 Feet Assistive device: Rolling walker (2 wheeled) Gait Pattern/deviations: Step-to pattern;Antalgic Gait velocity: decreased   General Gait  Details: Pt moderately antalgic on the RLE during amb but improved during the session    Stairs             Wheelchair Mobility    Modified Rankin (Stroke Patients Only)       Balance Overall balance assessment: No apparent balance deficits (not formally assessed)                                          Cognition Arousal/Alertness: Awake/alert Behavior During Therapy: WFL for tasks assessed/performed Overall Cognitive Status: Within Functional Limits for tasks assessed                                        Exercises Total Joint Exercises Ankle Circles/Pumps: AROM;Both;10 reps Quad Sets: Strengthening;Right;AROM;10 reps Gluteal Sets: Strengthening;Both;10 reps Hip ABduction/ADduction: AROM;AAROM;Both;10 reps Straight Leg Raises: AROM;AAROM;Both;10 reps Long Arc Quad: AROM;Right;10 reps;15 reps Knee Flexion: AROM;Right;10 reps;15 reps Goniometric ROM: R Knee AROM: 3-80 deg Marching in Standing: AROM;Both;10 reps Other Exercises Other Exercises: HEP education/review per handout    General Comments        Pertinent Vitals/Pain Pain Assessment: 0-10 Pain Score: 8  Pain Location: R knee Pain Descriptors / Indicators: Aching;Sore Pain Intervention(s): Premedicated before session;Monitored during session    Home Living  Prior Function            PT Goals (current goals can now be found in the care plan section) Progress towards PT goals: Progressing toward goals    Frequency    BID      PT Plan Current plan remains appropriate    Co-evaluation              AM-PAC PT "6 Clicks" Mobility   Outcome Measure  Help needed turning from your back to your side while in a flat bed without using bedrails?: None Help needed moving from lying on your back to sitting on the side of a flat bed without using bedrails?: None Help needed moving to and from a bed to a chair (including a  wheelchair)?: A Little Help needed standing up from a chair using your arms (e.g., wheelchair or bedside chair)?: A Little Help needed to walk in hospital room?: A Little Help needed climbing 3-5 steps with a railing? : A Little 6 Click Score: 20    End of Session Equipment Utilized During Treatment: Gait belt Activity Tolerance: Patient tolerated treatment well;No increased pain Patient left: Other (comment)(Pt in BR with understanding to pull cord for nursing when ready) Nurse Communication: Mobility status PT Visit Diagnosis: Other abnormalities of gait and mobility (R26.89);Muscle weakness (generalized) (M62.81)     Time: 1840-3754 PT Time Calculation (min) (ACUTE ONLY): 25 min  Charges:  $Gait Training: 8-22 mins $Therapeutic Exercise: 8-22 mins                     D. Scott Aedyn Kempfer PT, DPT 10/13/18, 11:33 AM

## 2018-10-13 NOTE — NC FL2 (Signed)
MEDICAID FL2 LEVEL OF CARE SCREENING TOOL     IDENTIFICATION  Patient Name: Caitlin Kelley Birthdate: 07/29/1956 Sex: female Admission Date (Current Location): 10/12/2018  [place] and [place] Number:  Chiropodist and Address:  [facility], [address] [place], [place] [address]      Provider Number: [id number]  Attending Physician Name and Address:  Lyndle Herrlich, MD  Relative Name and Phone Number:       Current Level of Care: Hospital Recommended Level of Care: Skilled Nursing Facility Prior Approval Number:    Date Approved/Denied:   PASRR Number: [id number])  Discharge Plan: SNF    Current Diagnoses: Patient Active Problem List   Diagnosis Date Noted  . Loosening of knee joint prosthesis (HCC) 10/12/2018    Orientation RESPIRATION BLADDER Height & Weight     Self, Time, Situation, Place  Normal Continent Weight: 197 lb 5 oz (89.5 kg) Height:  4\' 10"  (147.3 cm)  BEHAVIORAL SYMPTOMS/MOOD NEUROLOGICAL BOWEL NUTRITION STATUS      Continent Diet(Diet: Regular )  AMBULATORY STATUS COMMUNICATION OF NEEDS Skin   Extensive Assist Verbally Surgical wounds(Incision: Right Knee. )                       Personal Care Assistance Level of Assistance  Bathing, Feeding, Dressing Bathing Assistance: Limited assistance Feeding assistance: Independent Dressing Assistance: Limited assistance     Functional Limitations Info  Sight, Hearing, Speech Sight Info: Adequate Hearing Info: Impaired Speech Info: Adequate    SPECIAL CARE FACTORS FREQUENCY  PT (By licensed PT), OT (By licensed OT)     PT Frequency: (5) OT Frequency: (5)            Contractures      Additional Factors Info  Code Status, Allergies Code Status Info: (Full Code. ) Allergies Info: (Penicillins, Amoxicillin, Lodine Etodolac, Morphine And Related, Percocet Oxycodone-acetaminophen)           Current Medications (10/13/2018):  This is  the current hospital active medication list Current Facility-Administered Medications  Medication Dose Route Frequency Provider Last Rate Last Dose  . acetaminophen (TYLENOL) tablet 325-650 mg  325-650 mg Oral Q6H PRN Lyndle Herrlich, MD      . bisacodyl (DULCOLAX) suppository 10 mg  10 mg Rectal Daily PRN Lyndle Herrlich, MD      . calcium-vitamin D (OSCAL WITH D) 500-200 MG-UNIT per tablet 1 tablet  1 tablet Oral Daily Lyndle Herrlich, MD      . cholecalciferol (VITAMIN D) tablet 1,000 Units  1,000 Units Oral Daily Lyndle Herrlich, MD      . cyclobenzaprine (FLEXERIL) tablet 5 mg  5 mg Oral Q12H PRN Lyndle Herrlich, MD   5 mg at 10/12/18 2344  . docusate sodium (COLACE) capsule 100 mg  100 mg Oral BID Lyndle Herrlich, MD   100 mg at 10/12/18 2030  . Eluxadoline TABS 100 mg  100 mg Oral Daily Lyndle Herrlich, MD      . enoxaparin (LOVENOX) injection 40 mg  40 mg Subcutaneous Q24H Shanlever, Charles M, RPH      . gabapentin (NEURONTIN) capsule 100 mg  100 mg Oral Daily Lyndle Herrlich, MD      . gabapentin (NEURONTIN) capsule 300 mg  300 mg Oral QHS Lyndle Herrlich, MD   300 mg at 10/12/18 2030  . HYDROcodone-acetaminophen (NORCO) 7.5-325 MG per tablet 1-2 tablet  1-2 tablet  Oral Q4H PRN Lyndle Herrlich, MD      . HYDROcodone-acetaminophen (NORCO/VICODIN) 5-325 MG per tablet 1-2 tablet  1-2 tablet Oral Q4H PRN Lyndle Herrlich, MD   2 tablet at 10/12/18 2030  . lactated ringers infusion   Intravenous Continuous Lyndle Herrlich, MD 75 mL/hr at 10/12/18 1345    . magnesium citrate solution 1 Bottle  1 Bottle Oral Once PRN Lyndle Herrlich, MD      . magnesium hydroxide (MILK OF MAGNESIA) suspension 30 mL  30 mL Oral Daily PRN Lyndle Herrlich, MD      . menthol-cetylpyridinium (CEPACOL) lozenge 3 mg  1 lozenge Oral PRN Lyndle Herrlich, MD       Or  . phenol (CHLORASEPTIC) mouth spray 1 spray  1 spray Mouth/Throat PRN Lyndle Herrlich, MD      . metoCLOPramide (REGLAN) tablet 5-10 mg  5-10 mg Oral Q8H  PRN Lyndle Herrlich, MD       Or  . metoCLOPramide (REGLAN) injection 5-10 mg  5-10 mg Intravenous Q8H PRN Lyndle Herrlich, MD      . ondansetron [facility]) tablet 4 mg  4 mg Oral Q6H PRN Lyndle Herrlich, MD       Or  . ondansetron [facility]) injection 4 mg  4 mg Intravenous Q6H PRN Lyndle Herrlich, MD      . pantoprazole (PROTONIX) EC tablet 20 mg  20 mg Oral Daily Lyndle Herrlich, MD      . pravastatin (PRAVACHOL) tablet 40 mg  40 mg Oral QPM Lyndle Herrlich, MD   40 mg at 10/12/18 1804  . predniSONE (DELTASONE) tablet 20 mg  20 mg Oral BID WC Lyndle Herrlich, MD   20 mg at 10/12/18 1805  . rOPINIRole (REQUIP) tablet 1 mg  1 mg Oral QHS Lyndle Herrlich, MD   1 mg at 10/12/18 2030  . traMADol (ULTRAM) tablet 50 mg  50 mg Oral Q6H Lyndle Herrlich, MD   50 mg at 10/13/18 0503     Discharge Medications: Please see discharge summary for a list of discharge medications.  Relevant Imaging Results:  Relevant Lab Results:   Additional Information (SSN: [id number])  Caitlin Kelley, Darleen Crocker, LCSW

## 2018-10-14 LAB — CBC
HEMATOCRIT: 38.4 % (ref 36.0–46.0)
Hemoglobin: 12.3 g/dL (ref 12.0–15.0)
MCH: 29.8 pg (ref 26.0–34.0)
MCHC: 32 g/dL (ref 30.0–36.0)
MCV: 93 fL (ref 80.0–100.0)
Platelets: 245 10*3/uL (ref 150–400)
RBC: 4.13 MIL/uL (ref 3.87–5.11)
RDW: 13.5 % (ref 11.5–15.5)
WBC: 20.5 10*3/uL — ABNORMAL HIGH (ref 4.0–10.5)
nRBC: 0 % (ref 0.0–0.2)

## 2018-10-14 MED ORDER — HYDROCODONE-ACETAMINOPHEN 5-325 MG PO TABS: 1.0000 | ORAL_TABLET | ORAL | 0 refills | Status: DC | PRN

## 2018-10-14 MED ORDER — DOCUSATE SODIUM 100 MG PO CAPS: 100.0000 mg | ORAL_CAPSULE | Freq: Every day | ORAL | 2 refills | Status: AC | PRN

## 2018-10-14 NOTE — Care Management Important Message (Signed)
Important Message  Patient Details  Name: Caitlin Kelley MRN: [id number] Date of Birth: April 01, 1956   Medicare Important Message Given:  Yes    Olegario Messier A Wyland Rastetter 10/14/2018, 11:35 AM

## 2018-10-14 NOTE — Discharge Summary (Signed)
Physician Discharge Summary  Patient ID: Caitlin Kelley MRN: [id number] DOB/AGE: 63/12/1955 64 y.o.  Admit date: 10/12/2018 Discharge date: 10/14/2018  Admission Diagnoses:  osteoarthritis of right knee <principal problem not specified>  Discharge Diagnoses:  osteoarthritis of right knee Active Problems:   Loosening of knee joint prosthesis [facility](HCC)   Past Medical History:  Diagnosis Date  . Arthritis   . Complication of anesthesia   . GERD (gastroesophageal reflux disease)   . History of hiatal hernia   . IBS (irritable bowel syndrome)   . PONV (postoperative nausea and vomiting)   . Pre-diabetes     Surgeries: Procedure(s): TOTAL KNEE REVISION on 10/12/2018   Consultants (if any):   Discharged Condition: Improved  Hospital Course: Caitlin BunSylvia K Huizar is an 63 y.o. female who was admitted 10/12/2018 with a diagnosis of  osteoarthritis of right knee <principal problem not specified> and went to the operating room on 10/12/2018 and underwent the above named procedures.    She was given perioperative antibiotics:  Anti-infectives (From admission, onward)   Start     Dose/Rate Route Frequency Ordered Stop   10/12/18 1400  clindamycin (CLEOCIN) IVPB 600 mg     600 mg 100 mL/hr over 30 Minutes Intravenous Every 6 hours 10/12/18 1238 10/12/18 2010   10/12/18 1035  50,000 units bacitracin in 0.9% normal saline 250 mL irrigation  Status:  Discontinued       As needed 10/12/18 1035 10/12/18 1058   10/12/18 0600  clindamycin (CLEOCIN) IVPB 900 mg     900 mg 100 mL/hr over 30 Minutes Intravenous On call to O.R. 10/11/18 2140 10/12/18 0825    .  She was given sequential compression devices, early ambulation, and aspirin for DVT prophylaxis.  She benefited maximally from the hospital stay and there were no complications.    Recent vital signs:  Vitals:   10/13/18 2305 10/14/18 0728  BP: 130/81 140/79  Pulse: 96 90  Resp: 15   Temp: 98.3 F (36.8 C) 98.1 F (36.7 C)  SpO2: 98% 98%     Recent laboratory studies:  Lab Results  Component Value Date   HGB 12.3 10/14/2018   HGB 12.0 10/13/2018   HGB 12.3 10/12/2018   Lab Results  Component Value Date   WBC 20.5 (H) 10/14/2018   PLT 245 10/14/2018   Lab Results  Component Value Date   INR 0.91 09/30/2018   Lab Results  Component Value Date   NA 140 10/13/2018   K 4.1 10/13/2018   CL 108 10/13/2018   CO2 26 10/13/2018   BUN 11 10/13/2018   CREATININE 0.79 10/13/2018   GLUCOSE 145 (H) 10/13/2018    Discharge Medications:   Allergies as of 10/14/2018      Reactions   Penicillins Anaphylaxis, Other (See Comments)   DID THE REACTION INVOLVE: Swelling of the face/tongue/throat, SOB, or low BP? No Sudden or severe rash/hives, skin peeling, or the inside of the mouth or nose? Yes Did it require medical treatment? Yes When did it last happen? 1989 If all above answers are "NO", may proceed with cephalosporin use.   Amoxicillin Other (See Comments)   Unknown   Lodine [etodolac] Other (See Comments)   Unknown   Morphine And Related Nausea And Vomiting   Percocet [oxycodone-acetaminophen] Nausea And Vomiting      Medication List    STOP taking these medications   ibuprofen 800 MG tablet Commonly known as:  ADVIL,MOTRIN   levofloxacin 750 MG tablet Commonly known  as:  LEVAQUIN   meloxicam 7.5 MG tablet Commonly known as:  MOBIC     TAKE these medications   alendronate 70 MG tablet Commonly known as:  FOSAMAX Take 70 mg by mouth every Friday. Take with a full glass of water on an empty stomach.   CALCIUM + VITAMIN D3 PO Take 1 tablet by mouth daily.   cyclobenzaprine 5 MG tablet Commonly known as:  FLEXERIL Take 5 mg by mouth every 12 (twelve) hours as needed for muscle spasms.   docusate sodium 100 MG capsule Commonly known as:  COLACE Take 1 capsule (100 mg total) by mouth daily as needed.   gabapentin 300 MG capsule Commonly known as:  NEURONTIN Take 300 mg by mouth at bedtime.    gabapentin 100 MG capsule Commonly known as:  NEURONTIN Take 100 mg by mouth daily. In morning   HYDROcodone-acetaminophen 5-325 MG tablet Commonly known as:  NORCO/VICODIN Take 1-2 tablets by mouth every 4 (four) hours as needed for moderate pain (pain score 4-6).   omeprazole 20 MG tablet Commonly known as:  PRILOSEC OTC Take 20 mg by mouth daily.   ONE-A-DAY WOMENS PO Take 1 tablet by mouth daily.   pravastatin 40 MG tablet Commonly known as:  PRAVACHOL Take 40 mg by mouth every evening.   predniSONE 20 MG tablet Commonly known as:  DELTASONE Take 20 mg by mouth 2 (two) times daily with a meal.   rOPINIRole 1 MG tablet Commonly known as:  REQUIP Take 1 mg by mouth at bedtime.   traMADol-acetaminophen 37.5-325 MG tablet Commonly known as:  ULTRACET Take 1 tablet by mouth every 6 (six) hours as needed.   VIBERZI 100 MG Tabs Generic drug:  Eluxadoline Take 100 mg by mouth daily.   Vitamin D3 125 MCG (5000 UT) Caps Take 5,000 Units by mouth daily.       Diagnostic Studies: Dg Knee Right Port  Result Date: 10/12/2018 CLINICAL DATA:  Status post total knee replacement EXAM: PORTABLE RIGHT KNEE - 1-2 VIEW COMPARISON:  05/15/2009 FINDINGS: Changes of total right knee replacement. Soft tissue and joint space gas noted. No hardware bony complicating feature. IMPRESSION: Right knee replacement.  No visible complicating feature. Electronically Signed   By: Charlett NoseKevin  Dover M.D.   On: 10/12/2018 11:11    Disposition: Discharge disposition: 01-Home or Self Care            Signed: Lyndle HerrlichJames R Tawana Pasch ,MD 10/14/2018, 11:16 AM

## 2018-10-14 NOTE — Progress Notes (Signed)
  Subjective:  Patient reports pain as moderate.  Doing well after revision  Objective:   VITALS:   Vitals:   10/13/18 0740 10/13/18 1222 10/13/18 1537 10/13/18 2305  BP: 123/70 (!) 155/66 138/75 130/81  Pulse: 84 88 100 96  Resp:    15  Temp: 98.3 F (36.8 C) 98.2 F (36.8 C) 98.8 F (37.1 C) 98.3 F (36.8 C)  TempSrc:  Oral Oral   SpO2: 99% 96% 95% 98%  Weight:      Height:        PHYSICAL EXAM:  Neurologically intact ABD soft Neurovascular intact Sensation intact distally Intact pulses distally Incision: no drainage  LABS  Results for orders placed or performed during the hospital encounter of 10/12/18 (from the past 24 hour(s))  CBC     Status: Abnormal   Collection Time: 10/14/18  3:02 AM  Result Value Ref Range   WBC 20.5 (H) 4.0 - 10.5 K/uL   RBC 4.13 3.87 - 5.11 MIL/uL   Hemoglobin 12.3 12.0 - 15.0 g/dL   HCT 70.3 40.3 - 52.4 %   MCV 93.0 80.0 - 100.0 fL   MCH 29.8 26.0 - 34.0 pg   MCHC 32.0 30.0 - 36.0 g/dL   RDW 81.8 59.0 - 93.1 %   Platelets 245 150 - 400 K/uL   nRBC 0.0 0.0 - 0.2 %    Dg Knee Right Port  Result Date: 10/12/2018 CLINICAL DATA:  Status post total knee replacement EXAM: PORTABLE RIGHT KNEE - 1-2 VIEW COMPARISON:  05/15/2009 FINDINGS: Changes of total right knee replacement. Soft tissue and joint space gas noted. No hardware bony complicating feature. IMPRESSION: Right knee replacement.  No visible complicating feature. Electronically Signed   By: Charlett Nose M.D.   On: 10/12/2018 11:11    Assessment/Plan: 2 Days Post-Op   Active Problems:   Loosening of knee joint prosthesis (HCC)   Advance diet Up with therapy Discharge home with home health today   Altamese Cabal , MD 10/14/2018, 6:44 AM

## 2018-10-14 NOTE — Care Management (Signed)
Patient to discharge home with Home health today, Caitlin Kelley has accepted the patient at the patient's request

## 2018-10-14 NOTE — Discharge Instructions (Signed)
Continue weight bear as tolerated on the right lower extremity.    Elevate the right lower extremity whenever possible and continue the polar care while elevating the extremity. Patient may shower. No bath or submerging the wound.    Take aspirin as directed for blood clot prevention.  Continue to work on knee range of motion exercises at home as instructed by physical therapy. Continue to use a walker for assistance with ambulation until cleared by physical therapy.  Call [phone] with any questions, such as fever > 101.5 degrees, drainage from the wound or shortness of breath.  

## 2018-10-14 NOTE — Progress Notes (Signed)
Physical Therapy Treatment Patient Details Name: Caitlin BunSylvia K Henzler MRN: [id number] DOB: 03/14/1956 Today's Date: 10/14/2018    History of Present Illness Pt is a 63 yo female s/p R total knee arthroplasty revision.  PMH includes: GERD, hiatal hernia, DM II, and back surgery.    PT Comments    Pt presents with minor deficits in strength, transfers, mobility, gait, R knee ROM, and activity tolerance but is progressing very well towards goals.  Pt performs sup to/from sit quickly with great effort using slight assist from her LLE to get her RLE in and out of bed.  Pt steady during transfers with good eccentric and concentric control.  Pt ambulated 2 x 150' with good cadence and stability with step-through gait pattern.  Pt showed good carryover of proper sequencing during stair training and was able to ascend and descend 4 steps with only one rail with good control.  Pt will benefit from HHPT services upon discharge to safely address above deficits for decreased caregiver assistance and eventual return to PLOF.     Follow Up Recommendations  Home health PT     Equipment Recommendations  Tub transfer bench    Recommendations for Other Services       Precautions / Restrictions Precautions Precautions: Fall;Knee Restrictions Weight Bearing Restrictions: Yes RLE Weight Bearing: Weight bearing as tolerated    Mobility  Bed Mobility Overal bed mobility: Modified Independent             General bed mobility comments: Good speed and effort during bed mobiltiy tasks  Transfers Overall transfer level: Needs assistance Equipment used: Rolling walker (2 wheeled) Transfers: Sit to/from Stand Sit to Stand: Supervision         General transfer comment: Good control and stability with transfers with min verbal cues for sequencing.   Ambulation/Gait Ambulation/Gait assistance: Supervision Gait Distance (Feet): 150 Feet x 2 Assistive device: Rolling walker (2 wheeled) Gait  Pattern/deviations: Step-through pattern;Antalgic;Decreased stance time - right Gait velocity: decreased   General Gait Details: Good cadence and stability during ambulation with only mildly antalagic gait patterns and with no increased R knee pain   Stairs Stairs: Yes Stairs assistance: Supervision Stair Management: One rail Right Number of Stairs: 4 General stair comments: Visual/demonstration education for proper sequencing with stairs using one rail with teach back method; pt able to ascend and descend 4 steps with one rail with good carryover of proper sequencing and with very good control and stability.     Wheelchair Mobility    Modified Rankin (Stroke Patients Only)       Balance Overall balance assessment: No apparent balance deficits (not formally assessed)                                          Cognition Arousal/Alertness: Awake/alert Behavior During Therapy: WFL for tasks assessed/performed Overall Cognitive Status: Within Functional Limits for tasks assessed                                        Exercises Total Joint Exercises Ankle Circles/Pumps: AROM;Both;10 reps;15 reps Quad Sets: Strengthening;Right;AROM;10 reps;15 reps Gluteal Sets: Strengthening;Both;10 reps Heel Slides: AROM;Right;5 reps;10 reps(small amplitude) Hip ABduction/ADduction: AROM;AAROM;Both;10 reps Straight Leg Raises: AROM;AAROM;Both;10 reps Knee Flexion: AROM;Right;10 reps;15 reps Goniometric ROM: R knee AROM: 3-85 deg Other Exercises  Other Exercises: HEP education/review per handout    General Comments        Pertinent Vitals/Pain Pain Assessment: 0-10 Pain Score: 8  Pain Location: R knee Pain Descriptors / Indicators: Aching;Sore Pain Intervention(s): Premedicated before session;Monitored during session    Home Living                      Prior Function            PT Goals (current goals can now be found in the care plan  section) Progress towards PT goals: Progressing toward goals    Frequency    BID      PT Plan Current plan remains appropriate    Co-evaluation              AM-PAC PT "6 Clicks" Mobility   Outcome Measure  Help needed turning from your back to your side while in a flat bed without using bedrails?: None Help needed moving from lying on your back to sitting on the side of a flat bed without using bedrails?: None Help needed moving to and from a bed to a chair (including a wheelchair)?: None Help needed standing up from a chair using your arms (e.g., wheelchair or bedside chair)?: A Little Help needed to walk in hospital room?: A Little Help needed climbing 3-5 steps with a railing? : A Little 6 Click Score: 21    End of Session Equipment Utilized During Treatment: Gait belt Activity Tolerance: Patient tolerated treatment well;No increased pain Patient left: in bed;with bed alarm set;with call bell/phone within reach;Other (comment);with SCD's reapplied(Polar care to R knee) Nurse Communication: Mobility status PT Visit Diagnosis: Other abnormalities of gait and mobility (R26.89);Muscle weakness (generalized) (M62.81)     Time: [id number] PT Time Calculation (min) (ACUTE ONLY): 40 min  Charges:  $Gait Training: 8-22 mins $Therapeutic Exercise: 23-37 mins                     D. Scott Abigale Dorow PT, DPT 10/14/18, 11:40 AM

## 2018-10-14 NOTE — Progress Notes (Signed)
Per MD pt is okay to have BM at home. Pt is passing flattus and has no abdominal pain or nausea. Pt. Discharged to home via sons vehicle. Discharge instructions and medication regimen reviewed at bedside with patient. Pt. verbalizes understanding of instructions and medication regimen. Patient assessment unchanged from this morning. IV discontinued per policy.

## 2018-10-14 NOTE — Care Management (Signed)
Contacted [facility] to verify the cost of a shower transfer bench, the cost is 60$, the patient states that she does not want to get that. I explained that the 3 in 1 that she has at home already works as a Paediatric nurse also Patient states that she will look into getting something if she needs it that she has been transferring in and out of the tub like this for a long time.

## 2018-12-21 ENCOUNTER — Encounter: Payer: Self-pay | Admitting: Family Medicine

## 2019-01-25 ENCOUNTER — Encounter: Payer: Self-pay | Admitting: *Deleted

## 2019-04-04 ENCOUNTER — Ambulatory Visit (INDEPENDENT_AMBULATORY_CARE_PROVIDER_SITE_OTHER): Payer: Medicare HMO | Admitting: Gastroenterology

## 2019-04-04 ENCOUNTER — Encounter: Payer: Self-pay | Admitting: Gastroenterology

## 2019-04-04 ENCOUNTER — Other Ambulatory Visit: Payer: Self-pay

## 2019-04-04 VITALS — BP 137/77 | HR 84 | Temp 98.4°F | Ht <= 58 in | Wt 205.4 lb

## 2019-04-04 DIAGNOSIS — K582 Mixed irritable bowel syndrome: Secondary | ICD-10-CM | POA: Diagnosis not present

## 2019-04-04 DIAGNOSIS — Z1211 Encounter for screening for malignant neoplasm of colon: Secondary | ICD-10-CM

## 2019-04-04 DIAGNOSIS — K219 Gastro-esophageal reflux disease without esophagitis: Secondary | ICD-10-CM

## 2019-04-04 NOTE — H&P (View-Only) (Signed)
Gastroenterology Consultation  Referring Provider:     Armando GangLindley, Cheryl P, FNP Primary Care Physician:  Armando GangLindley, Cheryl P, FNP Primary Gastroenterologist:  Dr. Servando SnareWohl     Reason for Consultation:     GERD with diarrhea and constipation        HPI:   Caitlin Kelley is a 63 y.o. y/o female referred for consultation & management of GERD diarrhea and constipation by Dr. Clint GuyLindley, Fernand Parkinsheryl P, FNP.  This patient comes in with a history of colon polyps and reports that she was due for colonoscopy 5 years after her last one.  That was in 2014.  The patient also reports that she had knee surgery and after having diarrhea for most of her life she reports that after having her knee surgery and taking pain medication she has been having constipation with alternating diarrhea.  The patient has been taking Viberzi for her diarrhea and states she gets samples of this because she cannot afford it and it is not covered by her insurance.  There is no report of any unexplained weight loss fevers chills nausea or vomiting.  The patient has been on a PPI for some time due to her heartburn and states that as long she takes her omeprazole she is not having any problems but reports that when she stops it she has a lot of symptoms.  Her most notable issue is that she has regurgitation when she lays down at night with gastric contents coming up.  She reports that it is only bothersome when she is not taking her medication and it is very acidic.  Past Medical History:  Diagnosis Date  . Arthritis   . Complication of anesthesia   . GERD (gastroesophageal reflux disease)   . History of hiatal hernia   . IBS (irritable bowel syndrome)   . PONV (postoperative nausea and vomiting)   . Pre-diabetes     Past Surgical History:  Procedure Laterality Date  . ABDOMINAL HYSTERECTOMY    . BACK SURGERY    . CARPAL TUNNEL RELEASE    . KNEE SURGERY    . SHOULDER SURGERY    . TOTAL KNEE REVISION Right 10/12/2018   Procedure: TOTAL  KNEE REVISION;  Surgeon: Lyndle HerrlichBowers, James R, MD;  Location: [facility] ORS;  Service: Orthopedics;  Laterality: Right;    Prior to Admission medications   Medication Sig Start Date End Date Taking? Authorizing Provider  albuterol (VENTOLIN HFA) 108 (90 Base) MCG/ACT inhaler Ventolin HFA 90 mcg/actuation aerosol inhaler    [provider]  alendronate (FOSAMAX) 70 MG tablet Take 70 mg by mouth every Friday. Take with a full glass of water on an empty stomach.    [provider]  Calcium Carb-Cholecalciferol (CALCIUM + VITAMIN D3 PO) Take 1 tablet by mouth daily.    [provider]  cetirizine (ZYRTEC) 10 MG tablet Take 10 mg by mouth daily. 02/20/19   [provider]  Cholecalciferol (VITAMIN D3) 125 MCG (5000 UT) CAPS Take 5,000 Units by mouth daily.    [provider]  cyclobenzaprine (FLEXERIL) 5 MG tablet Take 5 mg by mouth every 12 (twelve) hours as needed for muscle spasms.    [provider]  docusate sodium (COLACE) 100 MG capsule Take 1 capsule (100 mg total) by mouth daily as needed. 10/14/18 10/14/19  Lyndle HerrlichBowers, James R, MD  Eluxadoline (VIBERZI) 100 MG TABS Take 100 mg by mouth daily.    [provider]  gabapentin (NEURONTIN) 100 MG  capsule Take 100 mg by mouth daily. In morning    [provider]  gabapentin (NEURONTIN) 300 MG capsule Take 300 mg by mouth at bedtime.    [provider]  HYDROcodone-acetaminophen (NORCO/VICODIN) 5-325 MG tablet Take 1-2 tablets by mouth every 4 (four) hours as needed for moderate pain (pain score 4-6). 10/14/18   Lovell Sheehan, MD  meloxicam (MOBIC) 7.5 MG tablet Take 7.5 mg by mouth 2 (two) times daily. 12/13/18   [provider]  Multiple Vitamins-Calcium (ONE-A-DAY WOMENS PO) Take 1 tablet by mouth daily.    [provider]  omeprazole (PRILOSEC OTC) 20 MG tablet Take 20 mg by mouth daily.    [provider]  omeprazole (PRILOSEC) 20 MG capsule Take by mouth.     [provider]  pravastatin (PRAVACHOL) 40 MG tablet Take 40 mg by mouth every evening.    [provider]  predniSONE (DELTASONE) 20 MG tablet Take 20 mg by mouth 2 (two) times daily with a meal.    [provider]  rOPINIRole (REQUIP) 1 MG tablet Take 1 mg by mouth at bedtime.    [provider]  SUMAtriptan (IMITREX) 50 MG tablet Take by mouth.    [provider]  traMADol Veatrice Bourbon) 50 MG tablet  10/25/18   [provider]  traMADol (ULTRAM) 50 MG tablet tramadol 50 mg tablet  TAKE 1 TABLET BY MOUTH EVERY 6 HOURS AS NEEDED    [provider]  traMADol-acetaminophen (ULTRACET) 37.5-325 MG tablet Take 1 tablet by mouth every 6 (six) hours as needed.    [provider]    No family history on file.   Social History   Tobacco Use  . Smoking status: Never Smoker  . Smokeless tobacco: Never Used  Substance Use Topics  . Alcohol use: No  . Drug use: No    Allergies as of 04/04/2019 - Review Complete 10/12/2018  Allergen Reaction Noted  . Penicillins Anaphylaxis and Other (See Comments) 04/08/2015  . Amoxicillin Other (See Comments) 04/08/2015  . Lodine [etodolac] Other (See Comments) 04/08/2015  . Morphine and related Nausea And Vomiting 04/08/2015  . Percocet [oxycodone-acetaminophen] Nausea And Vomiting 04/08/2015    Review of Systems:    All systems reviewed and negative except where noted in HPI.   Physical Exam:  There were no vitals taken for this visit. No LMP recorded. Patient has had a hysterectomy. General:   Alert,  Well-developed, well-nourished, pleasant and cooperative in NAD Head:  Normocephalic and atraumatic. Eyes:  Sclera clear, no icterus.   Conjunctiva pink. Ears:  Normal auditory acuity. Nose:  No deformity, discharge, or lesions. Mouth:  No deformity or lesions,oropharynx pink & moist. Neck:  Supple; no masses or thyromegaly. Lungs:  Respirations even and unlabored.  Clear  throughout to auscultation.   No wheezes, crackles, or rhonchi. No acute distress. Heart:  Regular rate and rhythm; no murmurs, clicks, rubs, or gallops. Abdomen:  Normal bowel sounds.  No bruits.  Soft, non-tender and non-distended without masses, hepatosplenomegaly or hernias noted.  No guarding or rebound tenderness.  Negative Carnett sign.   Rectal:  Deferred.  Msk:  Symmetrical without gross deformities.  Good, equal movement & strength bilaterally. Pulses:  Normal pulses noted. Extremities:  No clubbing or edema.  No cyanosis. Neurologic:  Alert and oriented x3;  grossly normal neurologically. Skin:  Intact without significant lesions or rashes.  No jaundice. Lymph Nodes:  No significant cervical adenopathy. Psych:  Alert and cooperative.  Normal mood and affect.  Imaging Studies: No results found.  Assessment and Plan:   Louie BunSylvia K Gitlin is a 63 y.o. y/o female is in today with a history of colon polyps and is in need of a colonoscopy.  The patient has also had a longstanding history of heartburn without any dysphasia.  She will also be set up for an EGD.  The patient has been told to continue her PPI and has been told that her symptoms are most consistent with irritable bowel syndrome with her alternating diarrhea and constipation. I have discussed risks & benefits which include, but are not limited to, bleeding, infection, perforation & drug reaction.  The patient agrees with this plan & written consent will be obtained.     Midge Miniumarren Jewelia Bocchino, MD. Clementeen GrahamFACG    Note: This dictation was prepared with Dragon dictation along with smaller phrase technology. Any transcriptional errors that result from this process are unintentional.

## 2019-04-04 NOTE — Progress Notes (Signed)
  Gastroenterology Consultation  Referring Provider:     Lindley, Cheryl P, FNP Primary Care Physician:  Lindley, Cheryl P, FNP Primary Gastroenterologist:  Dr. Lauralye Kinn     Reason for Consultation:     GERD with diarrhea and constipation        HPI:   Caitlin Kelley is a 62 y.o. y/o female referred for consultation & management of GERD diarrhea and constipation by Dr. Lindley, Cheryl P, FNP.  This patient comes in with a history of colon polyps and reports that she was due for colonoscopy 5 years after her last one.  That was in 2014.  The patient also reports that she had knee surgery and after having diarrhea for most of her life she reports that after having her knee surgery and taking pain medication she has been having constipation with alternating diarrhea.  The patient has been taking Viberzi for her diarrhea and states she gets samples of this because she cannot afford it and it is not covered by her insurance.  There is no report of any unexplained weight loss fevers chills nausea or vomiting.  The patient has been on a PPI for some time due to her heartburn and states that as long she takes her omeprazole she is not having any problems but reports that when she stops it she has a lot of symptoms.  Her most notable issue is that she has regurgitation when she lays down at night with gastric contents coming up.  She reports that it is only bothersome when she is not taking her medication and it is very acidic.  Past Medical History:  Diagnosis Date  . Arthritis   . Complication of anesthesia   . GERD (gastroesophageal reflux disease)   . History of hiatal hernia   . IBS (irritable bowel syndrome)   . PONV (postoperative nausea and vomiting)   . Pre-diabetes     Past Surgical History:  Procedure Laterality Date  . ABDOMINAL HYSTERECTOMY    . BACK SURGERY    . CARPAL TUNNEL RELEASE    . KNEE SURGERY    . SHOULDER SURGERY    . TOTAL KNEE REVISION Right 10/12/2018   Procedure: TOTAL  KNEE REVISION;  Surgeon: Bowers, James R, MD;  Location: [facility] ORS;  Service: Orthopedics;  Laterality: Right;    Prior to Admission medications   Medication Sig Start Date End Date Taking? Authorizing Provider  albuterol (VENTOLIN HFA) 108 (90 Base) MCG/ACT inhaler Ventolin HFA 90 mcg/actuation aerosol inhaler    [provider]  alendronate (FOSAMAX) 70 MG tablet Take 70 mg by mouth every Friday. Take with a full glass of water on an empty stomach.    [provider]  Calcium Carb-Cholecalciferol (CALCIUM + VITAMIN D3 PO) Take 1 tablet by mouth daily.    [provider]  cetirizine (ZYRTEC) 10 MG tablet Take 10 mg by mouth daily. 02/20/19   [provider]  Cholecalciferol (VITAMIN D3) 125 MCG (5000 UT) CAPS Take 5,000 Units by mouth daily.    [provider]  cyclobenzaprine (FLEXERIL) 5 MG tablet Take 5 mg by mouth every 12 (twelve) hours as needed for muscle spasms.    [provider]  docusate sodium (COLACE) 100 MG capsule Take 1 capsule (100 mg total) by mouth daily as needed. 10/14/18 10/14/19  Bowers, James R, MD  Eluxadoline (VIBERZI) 100 MG TABS Take 100 mg by mouth daily.    [provider]  gabapentin (NEURONTIN) 100 MG   capsule Take 100 mg by mouth daily. In morning    [provider]  gabapentin (NEURONTIN) 300 MG capsule Take 300 mg by mouth at bedtime.    [provider]  HYDROcodone-acetaminophen (NORCO/VICODIN) 5-325 MG tablet Take 1-2 tablets by mouth every 4 (four) hours as needed for moderate pain (pain score 4-6). 10/14/18   Lovell Sheehan, MD  meloxicam (MOBIC) 7.5 MG tablet Take 7.5 mg by mouth 2 (two) times daily. 12/13/18   [provider]  Multiple Vitamins-Calcium (ONE-A-DAY WOMENS PO) Take 1 tablet by mouth daily.    [provider]  omeprazole (PRILOSEC OTC) 20 MG tablet Take 20 mg by mouth daily.    [provider]  omeprazole (PRILOSEC) 20 MG capsule Take by mouth.     [provider]  pravastatin (PRAVACHOL) 40 MG tablet Take 40 mg by mouth every evening.    [provider]  predniSONE (DELTASONE) 20 MG tablet Take 20 mg by mouth 2 (two) times daily with a meal.    [provider]  rOPINIRole (REQUIP) 1 MG tablet Take 1 mg by mouth at bedtime.    [provider]  SUMAtriptan (IMITREX) 50 MG tablet Take by mouth.    [provider]  traMADol Veatrice Bourbon) 50 MG tablet  10/25/18   [provider]  traMADol (ULTRAM) 50 MG tablet tramadol 50 mg tablet  TAKE 1 TABLET BY MOUTH EVERY 6 HOURS AS NEEDED    [provider]  traMADol-acetaminophen (ULTRACET) 37.5-325 MG tablet Take 1 tablet by mouth every 6 (six) hours as needed.    [provider]    No family history on file.   Social History   Tobacco Use  . Smoking status: Never Smoker  . Smokeless tobacco: Never Used  Substance Use Topics  . Alcohol use: No  . Drug use: No    Allergies as of 04/04/2019 - Review Complete 10/12/2018  Allergen Reaction Noted  . Penicillins Anaphylaxis and Other (See Comments) 04/08/2015  . Amoxicillin Other (See Comments) 04/08/2015  . Lodine [etodolac] Other (See Comments) 04/08/2015  . Morphine and related Nausea And Vomiting 04/08/2015  . Percocet [oxycodone-acetaminophen] Nausea And Vomiting 04/08/2015    Review of Systems:    All systems reviewed and negative except where noted in HPI.   Physical Exam:  There were no vitals taken for this visit. No LMP recorded. Patient has had a hysterectomy. General:   Alert,  Well-developed, well-nourished, pleasant and cooperative in NAD Head:  Normocephalic and atraumatic. Eyes:  Sclera clear, no icterus.   Conjunctiva pink. Ears:  Normal auditory acuity. Nose:  No deformity, discharge, or lesions. Mouth:  No deformity or lesions,oropharynx pink & moist. Neck:  Supple; no masses or thyromegaly. Lungs:  Respirations even and unlabored.  Clear  throughout to auscultation.   No wheezes, crackles, or rhonchi. No acute distress. Heart:  Regular rate and rhythm; no murmurs, clicks, rubs, or gallops. Abdomen:  Normal bowel sounds.  No bruits.  Soft, non-tender and non-distended without masses, hepatosplenomegaly or hernias noted.  No guarding or rebound tenderness.  Negative Carnett sign.   Rectal:  Deferred.  Msk:  Symmetrical without gross deformities.  Good, equal movement & strength bilaterally. Pulses:  Normal pulses noted. Extremities:  No clubbing or edema.  No cyanosis. Neurologic:  Alert and oriented x3;  grossly normal neurologically. Skin:  Intact without significant lesions or rashes.  No jaundice. Lymph Nodes:  No significant cervical adenopathy. Psych:  Alert and cooperative.  Normal mood and affect.  Imaging Studies: No results found.  Assessment and Plan:   Caitlin Kelley is a 63 y.o. y/o female is in today with a history of colon polyps and is in need of a colonoscopy.  The patient has also had a longstanding history of heartburn without any dysphasia.  She will also be set up for an EGD.  The patient has been told to continue her PPI and has been told that her symptoms are most consistent with irritable bowel syndrome with her alternating diarrhea and constipation. I have discussed risks & benefits which include, but are not limited to, bleeding, infection, perforation & drug reaction.  The patient agrees with this plan & written consent will be obtained.     Caitlin Miniumarren Lorann Tani, MD. Clementeen GrahamFACG    Note: This dictation was prepared with Dragon dictation along with smaller phrase technology. Any transcriptional errors that result from this process are unintentional.

## 2019-04-21 ENCOUNTER — Other Ambulatory Visit
Admission: RE | Admit: 2019-04-21 | Discharge: 2019-04-21 | Disposition: A | Discharge status: Home / Self Care | Payer: Medicare HMO | Source: Ambulatory Visit | Attending: Gastroenterology | Admitting: Gastroenterology

## 2019-04-21 ENCOUNTER — Other Ambulatory Visit: Payer: Self-pay

## 2019-04-21 DIAGNOSIS — Z1159 Encounter for screening for other viral diseases: Secondary | ICD-10-CM | POA: Diagnosis present

## 2019-04-21 LAB — SARS CORONAVIRUS 2 (TAT 6-24 HRS): SARS Coronavirus 2: NEGATIVE

## 2019-04-25 ENCOUNTER — Other Ambulatory Visit: Payer: Self-pay

## 2019-04-25 ENCOUNTER — Ambulatory Visit: Payer: Medicare HMO | Admitting: Anesthesiology

## 2019-04-25 ENCOUNTER — Encounter
Admission: RE | Disposition: A | Discharge status: Home / Self Care | Payer: Self-pay | Source: Home / Self Care | Attending: Gastroenterology

## 2019-04-25 ENCOUNTER — Ambulatory Visit
Admission: RE | Admit: 2019-04-25 | Discharge: 2019-04-25 | Disposition: A | Discharge status: Home / Self Care | Payer: Medicare HMO | Attending: Gastroenterology | Admitting: Gastroenterology

## 2019-04-25 ENCOUNTER — Encounter: Payer: Self-pay | Admitting: *Deleted

## 2019-04-25 DIAGNOSIS — Z7952 Long term (current) use of systemic steroids: Secondary | ICD-10-CM | POA: Diagnosis not present

## 2019-04-25 DIAGNOSIS — Z6841 Body Mass Index (BMI) 40.0 and over, adult: Secondary | ICD-10-CM | POA: Diagnosis not present

## 2019-04-25 DIAGNOSIS — K580 Irritable bowel syndrome with diarrhea: Secondary | ICD-10-CM | POA: Diagnosis not present

## 2019-04-25 DIAGNOSIS — Z7983 Long term (current) use of bisphosphonates: Secondary | ICD-10-CM | POA: Diagnosis not present

## 2019-04-25 DIAGNOSIS — Z79899 Other long term (current) drug therapy: Secondary | ICD-10-CM | POA: Diagnosis not present

## 2019-04-25 DIAGNOSIS — Z791 Long term (current) use of non-steroidal anti-inflammatories (NSAID): Secondary | ICD-10-CM | POA: Diagnosis not present

## 2019-04-25 DIAGNOSIS — E119 Type 2 diabetes mellitus without complications: Secondary | ICD-10-CM | POA: Diagnosis not present

## 2019-04-25 DIAGNOSIS — Z860100 Personal history of colon polyps, unspecified: Secondary | ICD-10-CM

## 2019-04-25 DIAGNOSIS — K219 Gastro-esophageal reflux disease without esophagitis: Secondary | ICD-10-CM | POA: Insufficient documentation

## 2019-04-25 DIAGNOSIS — R12 Heartburn: Secondary | ICD-10-CM | POA: Diagnosis not present

## 2019-04-25 DIAGNOSIS — K5730 Diverticulosis of large intestine without perforation or abscess without bleeding: Secondary | ICD-10-CM | POA: Diagnosis not present

## 2019-04-25 DIAGNOSIS — Z1211 Encounter for screening for malignant neoplasm of colon: Secondary | ICD-10-CM | POA: Insufficient documentation

## 2019-04-25 HISTORY — PX: COLONOSCOPY WITH PROPOFOL: SHX5780

## 2019-04-25 HISTORY — PX: ESOPHAGOGASTRODUODENOSCOPY (EGD) WITH PROPOFOL: SHX5813

## 2019-04-25 SURGERY — COLONOSCOPY WITH PROPOFOL
Anesthesia: General

## 2019-04-25 MED ORDER — PROPOFOL 10 MG/ML IV BOLUS
INTRAVENOUS | Status: DC | PRN
  Administered 2019-04-25: 80 mg via INTRAVENOUS
  Administered 2019-04-25: 20 mg via INTRAVENOUS

## 2019-04-25 MED ORDER — PROPOFOL 500 MG/50ML IV EMUL
INTRAVENOUS | Status: DC | PRN
  Administered 2019-04-25: 150 ug/kg/min via INTRAVENOUS

## 2019-04-25 MED ORDER — SODIUM CHLORIDE 0.9 % IV SOLN
INTRAVENOUS | Status: DC
  Administered 2019-04-25: 08:00:00 via INTRAVENOUS

## 2019-04-25 MED ORDER — LIDOCAINE HCL (CARDIAC) PF 100 MG/5ML IV SOSY
PREFILLED_SYRINGE | INTRAVENOUS | Status: DC | PRN
  Administered 2019-04-25: 60 mg via INTRAVENOUS

## 2019-04-25 NOTE — Anesthesia Preprocedure Evaluation (Signed)
Anesthesia Evaluation  Patient identified by MRN, date of birth, ID band Patient awake    Reviewed: Allergy & Precautions, H&P , NPO status , Patient's Chart, lab work & pertinent test results  History of Anesthesia Complications (+) PONV and history of anesthetic complications  Airway Mallampati: III  TM Distance: <3 FB Neck ROM: limited    Dental  (+) Missing, Chipped, Poor Dentition, Upper Dentures   Pulmonary neg shortness of breath, neg COPD, Recent URI ,           Cardiovascular Exercise Tolerance: Good (-) angina(-) Past MI and (-) DOE negative cardio ROS       Neuro/Psych negative neurological ROS  negative psych ROS   GI/Hepatic Neg liver ROS, hiatal hernia, GERD  Medicated and Controlled,  Endo/Other  diabetes, Type 2Morbid obesity  Renal/GU      Musculoskeletal  (+) Arthritis ,   Abdominal   Peds  Hematology negative hematology ROS (+)   Anesthesia Other Findings Past Medical History: No date: Arthritis No date: Complication of anesthesia No date: GERD (gastroesophageal reflux disease) No date: History of hiatal hernia No date: IBS (irritable bowel syndrome) No date: PONV (postoperative nausea and vomiting) No date: Pre-diabetes  Past Surgical History: No date: ABDOMINAL HYSTERECTOMY No date: BACK SURGERY No date: CARPAL TUNNEL RELEASE No date: KNEE SURGERY No date: SHOULDER SURGERY     Reproductive/Obstetrics negative OB ROS                             Anesthesia Physical  Anesthesia Plan  ASA: III  Anesthesia Plan: General   Post-op Pain Management:  Regional for Post-op pain   Induction: Intravenous  PONV Risk Score and Plan: 4 or greater and Propofol infusion and TIVA  Airway Management Planned: Natural Airway and Nasal Cannula  Additional Equipment:   Intra-op Plan:   Post-operative Plan:   Informed Consent: I have reviewed the patients  History and Physical, chart, labs and discussed the procedure including the risks, benefits and alternatives for the proposed anesthesia with the patient or authorized representative who has indicated his/her understanding and acceptance.     Dental Advisory Given  Plan Discussed with: Anesthesiologist, CRNA and Surgeon  Anesthesia Plan Comments: (Patient reports no bleeding problems and no anticoagulant use.  Plan for spinal with backup GA  Patient consented for risks of anesthesia including but not limited to:  - adverse reactions to medications - risk of bleeding, infection, nerve damage and headache - risk of failed spinal - damage to teeth, lips or other oral mucosa - sore throat or hoarseness - Damage to heart, brain, lungs or loss of life  Patient voiced understanding.)        Anesthesia Quick Evaluation

## 2019-04-25 NOTE — Op Note (Signed)
[facility] Gastroenterology Patient Name: Caitlin Kelley Procedure Date: 04/25/2019 7:28 AM MRN: [id number] Account #: [id number] Date of Birth: 08/03/1956 Admit Type: Outpatient Age: 63 Room: [facility] ENDO ROOM 4 Gender: Female Note Status: Finalized Procedure:            Upper GI endoscopy Indications:          Heartburn Providers:            Lucilla Lame MD, MD Referring MD:         Jordan Likes. Lavena Bullion (Referring MD) Medicines:            Propofol per Anesthesia Complications:        No immediate complications. Procedure:            Pre-Anesthesia Assessment:                       - Prior to the procedure, a History and Physical was                        performed, and patient medications and allergies were                        reviewed. The patient's tolerance of previous                        anesthesia was also reviewed. The risks and benefits of                        the procedure and the sedation options and risks were                        discussed with the patient. All questions were                        answered, and informed consent was obtained. Prior                        Anticoagulants: The patient has taken no previous                        anticoagulant or antiplatelet agents. ASA Grade                        Assessment: II - A patient with mild systemic disease.                        After reviewing the risks and benefits, the patient was                        deemed in satisfactory condition to undergo the                        procedure.                       After obtaining informed consent, the endoscope was                        passed under direct vision. Throughout the procedure,  the patient's blood pressure, pulse, and oxygen                        saturations were monitored continuously. The Endoscope                        was introduced through the mouth, and advanced to the                        second  part of duodenum. The upper GI endoscopy was                        accomplished without difficulty. The patient tolerated                        the procedure well. Findings:      The Z-line was irregular and was found in the lower third of the       esophagus.      The stomach was normal.      The examined duodenum was normal. Impression:           - Z-line irregular, in the lower third of the esophagus.                       - Normal stomach.                       - Normal examined duodenum.                       - No specimens collected. Recommendation:       - Discharge patient to home.                       - Resume previous diet.                       - Continue present medications.                       - Perform a colonoscopy today. Procedure Code(s):    --- Professional ---                       [phone], Esophagogastroduodenoscopy, flexible, transoral;                        diagnostic, including collection of specimen(s) by                        brushing or washing, when performed (separate procedure) Diagnosis Code(s):    --- Professional ---                       R12, Heartburn CPT copyright 2019 American Medical Association. All rights reserved. The codes documented in this report are preliminary and upon coder review may  be revised to meet current compliance requirements. Midge Miniumarren Karson Reede MD, MD 04/25/2019 8:04:55 AM This report has been signed electronically. Number of Addenda: 0 Note Initiated On: 04/25/2019 7:28 AM Estimated Blood Loss: Estimated blood loss: none.      [facility]

## 2019-04-25 NOTE — Transfer of Care (Signed)
Immediate Anesthesia Transfer of Care Note  Patient: Caitlin Kelley  Procedure(s) Performed: COLONOSCOPY WITH PROPOFOL (N/A ) ESOPHAGOGASTRODUODENOSCOPY (EGD) WITH PROPOFOL (N/A )  Patient Location: Endoscopy Unit  Anesthesia Type:General  Level of Consciousness: drowsy and patient cooperative  Airway & Oxygen Therapy: Patient Spontanous Breathing and Patient connected to nasal cannula oxygen  Post-op Assessment: Report given to RN and Post -op Vital signs reviewed and stable  Post vital signs: Reviewed and stable  Last Vitals:  Vitals Value Taken Time  BP 129/86 04/25/19 0832  Temp 36.2 C 04/25/19 0831  Pulse 83 04/25/19 0831  Resp 20 04/25/19 0832  SpO2 95 % 04/25/19 0832    Last Pain:  Vitals:   04/25/19 0831  TempSrc: Tympanic  PainSc: Asleep         Complications: No apparent anesthesia complications

## 2019-04-25 NOTE — Interval H&P Note (Signed)
History and Physical Interval Note:  04/25/2019 7:31 AM  Caitlin Kelley  has presented today for surgery, with the diagnosis of Screening Z12.11 GERD K21.9.  The various methods of treatment have been discussed with the patient and family. After consideration of risks, benefits and other options for treatment, the patient has consented to  Procedure(s): COLONOSCOPY WITH PROPOFOL (N/A) ESOPHAGOGASTRODUODENOSCOPY (EGD) WITH PROPOFOL (N/A) as a surgical intervention.  The patient's history has been reviewed, patient examined, no change in status, stable for surgery.  I have reviewed the patient's chart and labs.  Questions were answered to the patient's satisfaction.     Kelon Easom [employer]

## 2019-04-25 NOTE — Anesthesia Postprocedure Evaluation (Signed)
Anesthesia Post Note  Patient: Caitlin Kelley  Procedure(s) Performed: COLONOSCOPY WITH PROPOFOL (N/A ) ESOPHAGOGASTRODUODENOSCOPY (EGD) WITH PROPOFOL (N/A )  Patient location during evaluation: Endoscopy Anesthesia Type: General Level of consciousness: awake and alert Pain management: pain level controlled Vital Signs Assessment: post-procedure vital signs reviewed and stable Respiratory status: spontaneous breathing, nonlabored ventilation, respiratory function stable and patient connected to nasal cannula oxygen Cardiovascular status: blood pressure returned to baseline and stable Postop Assessment: no apparent nausea or vomiting Anesthetic complications: no     Last Vitals:  Vitals:   04/25/19 0851 04/25/19 0901  BP: (!) 144/90 (!) 151/86  Pulse: 79 73  Resp: (!) 23 19  Temp:    SpO2: 95% 94%    Last Pain:  Vitals:   04/25/19 0901  TempSrc:   PainSc: 0-No pain                 Martha Clan

## 2019-04-25 NOTE — Op Note (Addendum)
[facility] Gastroenterology Patient Name: Caitlin Kelley Procedure Date: 04/25/2019 7:25 AM MRN: [id number] Account #: [id number] Date of Birth: 05/22/1956 Admit Type: Outpatient Age: 63 Room: [facility] ENDO ROOM 4 Gender: Female Note Status: Finalized Procedure:            Colonoscopy Indications:          High risk colon cancer surveillance: Personal history                        of colonic polyps Providers:            Lucilla Lame MD, MD Medicines:            Propofol per Anesthesia Complications:        No immediate complications. Procedure:            Pre-Anesthesia Assessment:                       - Prior to the procedure, a History and Physical was                        performed, and patient medications and allergies were                        reviewed. The patient's tolerance of previous                        anesthesia was also reviewed. The risks and benefits of                        the procedure and the sedation options and risks were                        discussed with the patient. All questions were                        answered, and informed consent was obtained. Prior                        Anticoagulants: The patient has taken no previous                        anticoagulant or antiplatelet agents. ASA Grade                        Assessment: II - A patient with mild systemic disease.                        After reviewing the risks and benefits, the patient was                        deemed in satisfactory condition to undergo the                        procedure.                       After obtaining informed consent, the colonoscope was                        passed under direct vision. Throughout the  procedure,                        the patient's blood pressure, pulse, and oxygen                        saturations were monitored continuously. The                        Colonoscope was introduced through the anus and     advanced to the the cecum, identified by appendiceal                        orifice and ileocecal valve. The colonoscopy was                        performed without difficulty. The patient tolerated the                        procedure well. The quality of the bowel preparation                        was excellent. Findings:      The perianal and digital rectal examinations were normal.      Multiple small-mouthed diverticula were found in the sigmoid colon.      Random biopsies were obtained with cold forceps for histology randomly       in the entire colon. Impression:           - Diverticulosis in the sigmoid colon.                       - Random biopsies were obtained in the entire colon. Recommendation:       - Discharge patient to home.                       - Resume previous diet.                       - Continue present medications.                       - Await pathology results. Procedure Code(s):    --- Professional ---                       [phone], Colonoscopy, flexible; with biopsy, single or                        multiple Diagnosis Code(s):    --- Professional ---                       Z86.010, Personal history of colonic polyps CPT copyright 2019 American Medical Association. All rights reserved. The codes documented in this report are preliminary and upon coder review may  be revised to meet current compliance requirements. Midge Miniumarren Darrly Loberg MD, MD 04/25/2019 8:26:37 AM This report has been signed electronically. Number of Addenda: 0 Note Initiated On: 04/25/2019 7:25 AM Scope Withdrawal Time: 0 hours 5 minutes 48 seconds  Total Procedure Duration: 0 hours 17 minutes 10 seconds  Estimated Blood Loss: Estimated blood loss: none.      [facility]

## 2019-04-25 NOTE — Anesthesia Post-op Follow-up Note (Signed)
Anesthesia QCDR form completed.        

## 2019-04-26 ENCOUNTER — Encounter: Payer: Self-pay | Admitting: Gastroenterology

## 2019-04-26 LAB — SURGICAL PATHOLOGY

## 2019-04-28 ENCOUNTER — Encounter: Payer: Self-pay | Admitting: Gastroenterology

## 2020-03-12 ENCOUNTER — Other Ambulatory Visit: Payer: Self-pay | Admitting: Family Medicine

## 2020-03-12 DIAGNOSIS — Z1231 Encounter for screening mammogram for malignant neoplasm of breast: Secondary | ICD-10-CM

## 2020-07-19 IMAGING — DX DG KNEE 1-2V PORT*R*
2 series · 2 of 2 positions shown · non-contrast
Comparison: 05/15/2009

CLINICAL DATA: Status post total knee replacement

EXAM:
PORTABLE RIGHT KNEE - 1-2 VIEW

[knee lat]
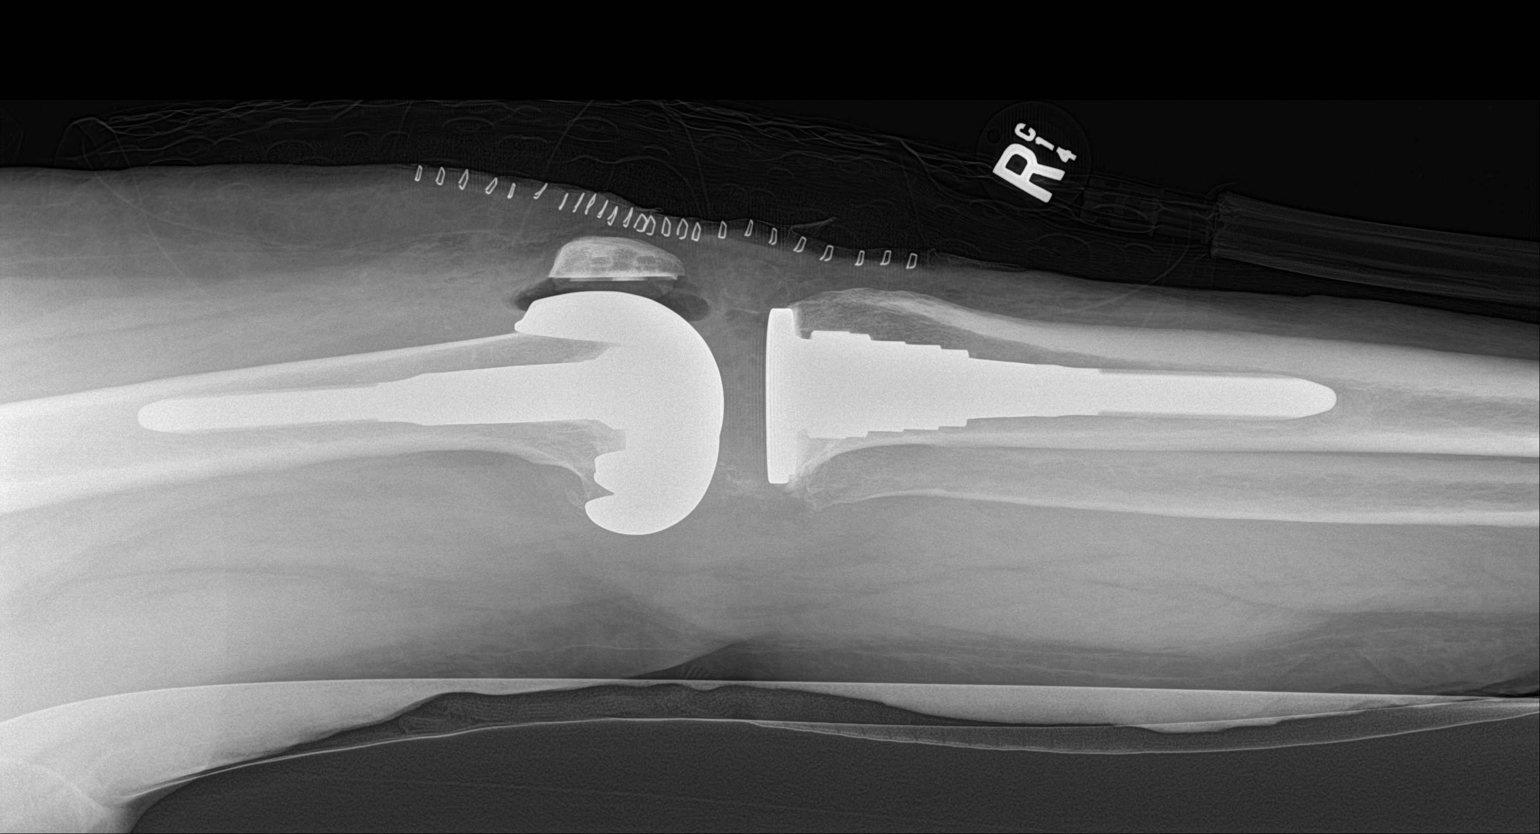

[knee ap]
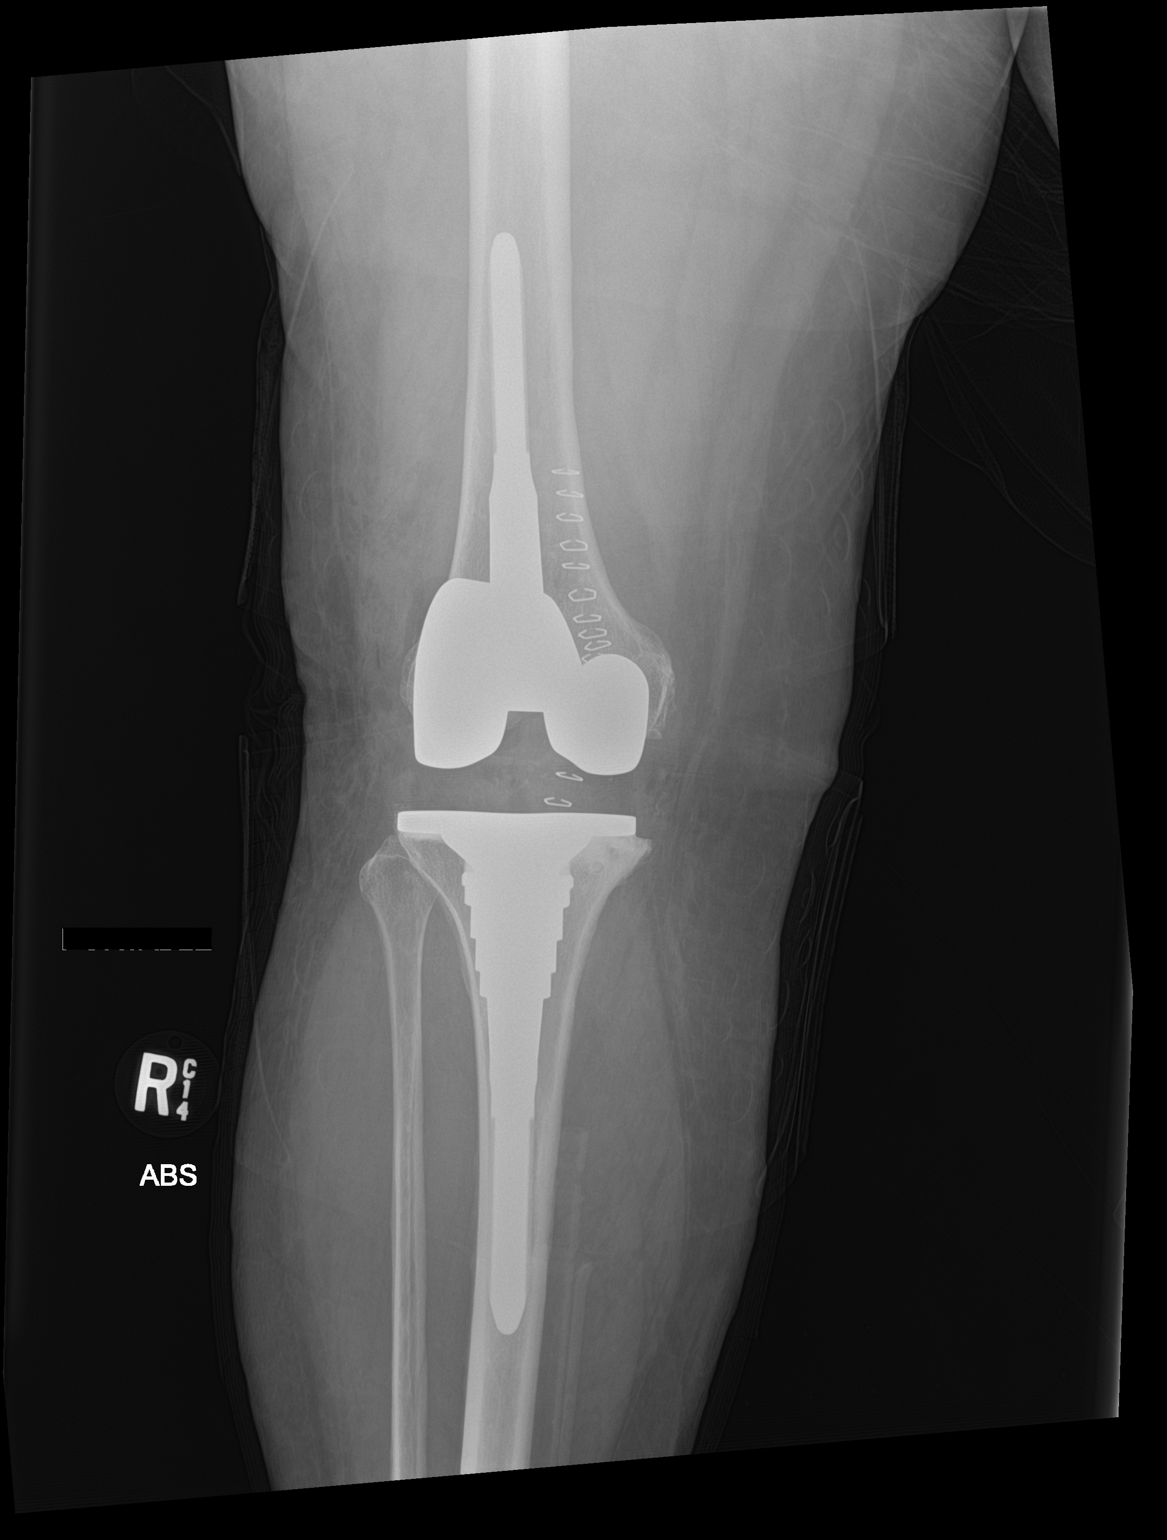

[2 of 2 positions shown; findings below may reference images not displayed]

FINDINGS: Changes of total right knee replacement. Soft tissue and joint space
gas noted. No hardware bony complicating feature.
IMPRESSION: Right knee replacement.  No visible complicating feature.

## 2021-03-25 ENCOUNTER — Other Ambulatory Visit: Payer: Self-pay | Admitting: Family Medicine

## 2021-03-25 DIAGNOSIS — Z1231 Encounter for screening mammogram for malignant neoplasm of breast: Secondary | ICD-10-CM

## 2022-10-23 ENCOUNTER — Other Ambulatory Visit: Payer: Self-pay | Admitting: Physician Assistant

## 2022-10-23 DIAGNOSIS — R079 Chest pain, unspecified: Secondary | ICD-10-CM

## 2022-10-23 DIAGNOSIS — E785 Hyperlipidemia, unspecified: Secondary | ICD-10-CM

## 2022-10-23 DIAGNOSIS — R0609 Other forms of dyspnea: Secondary | ICD-10-CM

## 2022-11-02 ENCOUNTER — Emergency Department: Payer: 59

## 2022-11-02 ENCOUNTER — Emergency Department
Admission: EM | Admit: 2022-11-02 | Discharge: 2022-11-03 | Disposition: A | Discharge status: Home / Self Care | Payer: 59 | Attending: Emergency Medicine | Admitting: Emergency Medicine

## 2022-11-02 DIAGNOSIS — I4891 Unspecified atrial fibrillation: Secondary | ICD-10-CM | POA: Diagnosis not present

## 2022-11-02 DIAGNOSIS — R002 Palpitations: Secondary | ICD-10-CM | POA: Insufficient documentation

## 2022-11-02 DIAGNOSIS — I2510 Atherosclerotic heart disease of native coronary artery without angina pectoris: Secondary | ICD-10-CM | POA: Insufficient documentation

## 2022-11-02 DIAGNOSIS — R000 Tachycardia, unspecified: Secondary | ICD-10-CM | POA: Diagnosis present

## 2022-11-02 DIAGNOSIS — I10 Essential (primary) hypertension: Secondary | ICD-10-CM | POA: Diagnosis not present

## 2022-11-02 LAB — COMPREHENSIVE METABOLIC PANEL
ALT: 28 U/L (ref 0–44)
AST: 28 U/L (ref 15–41)
Albumin: 4 g/dL (ref 3.5–5.0)
Alkaline Phosphatase: 60 U/L (ref 38–126)
Anion gap: 6 (ref 5–15)
BUN: 10 mg/dL (ref 8–23)
CO2: 26 mmol/L (ref 22–32)
Calcium: 9.2 mg/dL (ref 8.9–10.3)
Chloride: 108 mmol/L (ref 98–111)
Creatinine, Ser: 0.68 mg/dL (ref 0.44–1.00)
GFR, Estimated: >60 mL/min (ref >60)
Glucose, Bld: 120 mg/dL — ABNORMAL HIGH (ref 70–99)
Potassium: 3.5 mmol/L (ref 3.5–5.1)
Sodium: 140 mmol/L (ref 135–145)
Total Bilirubin: 0.4 mg/dL (ref 0.3–1.2)
Total Protein: 6.5 g/dL (ref 6.5–8.1)

## 2022-11-02 LAB — CBC WITH DIFFERENTIAL/PLATELET
Abs Immature Granulocytes: 0.06 10*3/uL (ref 0.00–0.07)
Basophils Absolute: 0.1 10*3/uL (ref 0.0–0.1)
Basophils Relative: 1 %
Eosinophils Absolute: 0.7 10*3/uL — ABNORMAL HIGH (ref 0.0–0.5)
Eosinophils Relative: 8 %
HCT: 42.9 % (ref 36.0–46.0)
Hemoglobin: 14.1 g/dL (ref 12.0–15.0)
Immature Granulocytes: 1 %
Lymphocytes Relative: 33 %
Lymphs Abs: 3.2 10*3/uL (ref 0.7–4.0)
MCH: 30 pg (ref 26.0–34.0)
MCHC: 32.9 g/dL (ref 30.0–36.0)
MCV: 91.3 fL (ref 80.0–100.0)
Monocytes Absolute: 0.8 10*3/uL (ref 0.1–1.0)
Monocytes Relative: 8 %
Neutro Abs: 4.7 10*3/uL (ref 1.7–7.7)
Neutrophils Relative %: 49 %
Platelets: 281 10*3/uL (ref 150–400)
RBC: 4.7 MIL/uL (ref 3.87–5.11)
RDW: 13.2 % (ref 11.5–15.5)
WBC: 9.5 10*3/uL (ref 4.0–10.5)
nRBC: 0 % (ref 0.0–0.2)

## 2022-11-02 LAB — TROPONIN I (HIGH SENSITIVITY): Troponin I (High Sensitivity): 5 ng/L (ref <18)

## 2022-11-02 MED ORDER — APIXABAN 5 MG PO TABS
5.0000 mg | ORAL_TABLET | Freq: Once | ORAL | Status: AC
  Administered 2022-11-03: 5 mg via ORAL
  Filled 2022-11-02: qty 1

## 2022-11-02 MED ORDER — METOPROLOL SUCCINATE ER 25 MG PO TB24
25.0000 mg | ORAL_TABLET | Freq: Once | ORAL | Status: AC
  Administered 2022-11-03: 25 mg via ORAL
  Filled 2022-11-02: qty 1

## 2022-11-02 NOTE — ED Provider Notes (Signed)
[facility] Provider Note    Event Date/Time   First MD Initiated Contact with Patient 11/02/22 2301     (approximate)   History   Irregular Heart Beat   HPI  Caitlin Kelley is a 67 y.o. female who presents to the ED for evaluation of Irregular Heart Beat   I review cardiology clinic visit from 1/16. Hx DM, HLD, HTN. Was referred due to intermittent palpitations. Echo w normal EF. Fam Hx CAD, none personally.  She has a CT coronary study scheduled for next week and reports having an echo done earlier today but does not know the results yet.  She presented to the ED for an episode of palpitations.  She reports intermittent palpitations for the past couple months that are brief, but the one today lasted multiple hours which is why she presented.  Started while she was at rest.  By time I see her after she is roomed, she reports resolution of her symptoms and feeling fine.  No recent illnesses, fevers, syncopal episodes.  No chest pain.   Physical Exam   Triage Vital Signs: ED Triage Vitals  Enc Vitals Group     BP 11/02/22 2247 (!) 196/108     Pulse Rate 11/02/22 2247 (!) 58     Resp 11/02/22 2247 19     Temp 11/02/22 2247 98.9 F (37.2 C)     Temp Source 11/02/22 2247 Oral     SpO2 11/02/22 2247 100 %     Weight 11/02/22 2244 200 lb (90.7 kg)     Height 11/02/22 2244 4\' 10"  (1.473 m)     Head Circumference --      Peak Flow --      Pain Score --      Pain Loc --      Pain Edu? --      Excl. in GC? --     Most recent vital signs: Vitals:   11/02/22 2330 11/03/22 0000  BP: 132/66 135/74  Pulse: 80 92  Resp: 20 18  Temp:    SpO2: 96% 95%    General: Awake, no distress.  Well-appearing. CV:  Good peripheral perfusion.  Resp:  Normal effort.  Abd:  No distention.  MSK:  No deformity noted.  Neuro:  No focal deficits appreciated. Other:     ED Results / Procedures / Treatments   Labs (all labs ordered are listed, but only  abnormal results are displayed) Labs Reviewed  CBC WITH DIFFERENTIAL/PLATELET - Abnormal; Notable for the following components:      Result Value   Eosinophils Absolute 0.7 (*)    All other components within normal limits  COMPREHENSIVE METABOLIC PANEL - Abnormal; Notable for the following components:   Glucose, Bld 120 (*)    All other components within normal limits  TROPONIN I (HIGH SENSITIVITY)  TROPONIN I (HIGH SENSITIVITY)    EKG A-fib with RVR, rate of 157 bpm.  Normal axis.  Multiple PACs.  No STEMI.  Repeat demonstrates a sinus rhythm with a rate of 91 bpm.  Normal axis and intervals.  No clear signs of acute ischemia.  RADIOLOGY CXR interpreted by me without evidence of acute cardiopulmonary pathology.  Official radiology report(s): DG Chest Portable 1 View  Result Date: 11/03/2022 CLINICAL DATA:  Tachycardia, shortness of breath on exertion EXAM: PORTABLE CHEST 1 VIEW COMPARISON:  12/28/2016 FINDINGS: Lungs are clear.  No pleural effusion or pneumothorax. The heart is normal in size. Thoracic spine  fixation hardware. IMPRESSION: No evidence of acute cardiopulmonary disease. Electronically Signed   By: Julian Hy M.D.   On: 11/03/2022 00:07    PROCEDURES and INTERVENTIONS:  .1-3 Lead EKG Interpretation  Performed by: Vladimir Crofts, MD Authorized by: Vladimir Crofts, MD     Interpretation: normal     ECG rate:  90   ECG rate assessment: normal     Rhythm: sinus rhythm     Ectopy: none     Conduction: normal   .Critical Care  Performed by: Vladimir Crofts, MD Authorized by: Vladimir Crofts, MD   Critical care provider statement:    Critical care time (minutes):  30   Critical care time was exclusive of:  Separately billable procedures and treating other patients   Critical care was time spent personally by me on the following activities:  Development of treatment plan with patient or surrogate, discussions with consultants, evaluation of patient's response to  treatment, examination of patient, ordering and review of laboratory studies, ordering and review of radiographic studies, ordering and performing treatments and interventions, pulse oximetry, re-evaluation of patient's condition and review of old charts   Medications  metoprolol succinate (TOPROL-XL) 24 hr tablet 25 mg (25 mg Oral Given 11/03/22 0010)  apixaban (ELIQUIS) tablet 5 mg (5 mg Oral Given 11/03/22 0010)     IMPRESSION / MDM / [place] / ED COURSE  I reviewed the triage vital signs and the nursing notes.  Differential diagnosis includes, but is not limited to, ACS, PTX, PNA, muscle strain/spasm, PE, dissection  {Patient presents with symptoms of an acute illness or injury that is potentially life-threatening.  Patient presents with another episode of palpitations, likely symptomatic atrial fibrillation suitable for outpatient management with close cardiology follow-up.  Her first EKG in triage was noted to be A-fib with RVR that self resolves and she subsequently remains in sinus rhythm on the monitor as well as asymptomatic.  She was a benign workup otherwise with normal potassium, troponin and CBC.  Clear CXR.  After discussing risks and benefits, recommend starting low-dose metoprolol as well as Eliquis for stroke prevention.  We discussed the importance of following up closely with a cardiologist that she just recently established with as well as return precautions for the ED.  I considered observation admission for this patient, but I think she is suitable for trial of outpatient management  Clinical Course as of 11/03/22 0026  Tue Nov 03, 2022  0008 Repeat EKG reassuring in a sinus rhythm, remains asymptomatic [DS]  0020 Reassessed.  Remains asymptomatic, sinus rhythm on the monitor.  Discussed reassuring workup.  Discussed starting low-dose of metoprolol as well as Eliquis.  We discussed risks of Eliquis as well as return precautions for general anticoagulation such as  falls and trauma.  We discussed return precautions for the ED and following up with her cardiologist.  Answered questions and provided education [DS]    Clinical Course User Index [DS] Vladimir Crofts, MD     FINAL CLINICAL IMPRESSION(S) / ED DIAGNOSES   Final diagnoses:  New onset atrial fibrillation ([place])  Palpitations     Rx / DC Orders   ED Discharge Orders          Ordered    APIXABAN (ELIQUIS) VTE STARTER PACK (10MG  AND 5MG )        11/03/22 0014    apixaban (ELIQUIS) 5 MG TABS tablet  2 times daily        11/03/22 0014  metoprolol succinate (TOPROL XL) 25 MG 24 hr tablet  Daily        11/03/22 0014             Note:  This document was prepared using Dragon voice recognition software and may include unintentional dictation errors.   Vladimir Crofts, MD 11/03/22 [phone]

## 2022-11-02 NOTE — ED Triage Notes (Signed)
Ambulatory to triage with c/o erratic heart rate since apx 8pm this evening. Pt states  "The paramedics said it was all over the place." Denies chest pain, but endorses dyspnea on exertion.  Denies fever at home or any recent illness.  Pt had Echo completed today but unsure of results, and scheduled for CTA in near future

## 2022-11-03 ENCOUNTER — Other Ambulatory Visit: Payer: Self-pay

## 2022-11-03 MED ORDER — APIXABAN 5 MG PO TABS: 5.0000 mg | ORAL_TABLET | Freq: Two times a day (BID) | ORAL | 1 refills | Status: AC

## 2022-11-03 MED ORDER — APIXABAN (ELIQUIS) VTE STARTER PACK (10MG AND 5MG): ORAL_TABLET | ORAL | 0 refills | Status: AC

## 2022-11-03 MED ORDER — METOPROLOL SUCCINATE ER 25 MG PO TB24: 25.0000 mg | ORAL_TABLET | Freq: Every day | ORAL | 2 refills | Status: AC

## 2022-11-03 NOTE — Discharge Instructions (Signed)
As we discussed, you have atrial fibrillation likely causing your heart beating sensation.  This is an abnormal heart rhythm that occurs occasionally.  You are being discharged with 2 new medications: Metoprolol.  Small does take once daily to help reduce heart rate and prevent rapid heartbeat sensation.  Eliquis.  A blood thinner to prevent strokes and other complications from A-fib.  This comes as a standard starter pack and there is also a second prescription for after the starter pack that you do not need to fill at the same time.  Please follow-up with your cardiologist in the clinic.  Please continue the workup they have started, including the CT scan that is upcoming.  If you have any chest pains or bleeding sensations that do not go away, fevers or passing out with your symptoms, then please return to the ED.

## 2022-11-06 ENCOUNTER — Telehealth (HOSPITAL_COMMUNITY): Payer: Self-pay | Admitting: *Deleted

## 2022-11-06 ENCOUNTER — Other Ambulatory Visit (HOSPITAL_COMMUNITY): Payer: Self-pay | Admitting: *Deleted

## 2022-11-06 ENCOUNTER — Encounter (HOSPITAL_COMMUNITY): Payer: Self-pay

## 2022-11-06 MED ORDER — METOPROLOL TARTRATE 25 MG PO TABS: ORAL_TABLET | ORAL | 0 refills | Status: DC

## 2022-11-06 NOTE — Telephone Encounter (Signed)
Reaching out to patient to offer assistance regarding upcoming cardiac imaging study; pt verbalizes understanding of appt date/time, parking situation and where to check in, pre-test NPO status and medications ordered, and verified current allergies; name and call back number provided for further questions should they arise  Amand Lemoine RN Navigator Cardiac Imaging [facility] [phone] office [phone] cell  Patient to take 25mg metoprolol tartrate two hours prior to her cardiac CT scan.   

## 2022-11-09 ENCOUNTER — Ambulatory Visit
Admission: RE | Admit: 2022-11-09 | Discharge: 2022-11-09 | Disposition: A | Discharge status: Home / Self Care | Payer: 59 | Source: Ambulatory Visit | Attending: Physician Assistant | Admitting: Physician Assistant

## 2022-11-09 DIAGNOSIS — I2510 Atherosclerotic heart disease of native coronary artery without angina pectoris: Secondary | ICD-10-CM | POA: Insufficient documentation

## 2022-11-09 DIAGNOSIS — R0609 Other forms of dyspnea: Secondary | ICD-10-CM | POA: Insufficient documentation

## 2022-11-09 DIAGNOSIS — R079 Chest pain, unspecified: Secondary | ICD-10-CM | POA: Diagnosis not present

## 2022-11-09 DIAGNOSIS — E785 Hyperlipidemia, unspecified: Secondary | ICD-10-CM | POA: Diagnosis not present

## 2022-11-09 MED ORDER — METOPROLOL TARTRATE 5 MG/5ML IV SOLN
10.0000 mg | Freq: Once | INTRAVENOUS | Status: AC
  Administered 2022-11-09: 10 mg via INTRAVENOUS

## 2022-11-09 MED ORDER — NITROGLYCERIN 0.4 MG SL SUBL
0.8000 mg | SUBLINGUAL_TABLET | Freq: Once | SUBLINGUAL | Status: AC
  Administered 2022-11-09: 0.8 mg via SUBLINGUAL

## 2022-11-09 MED ORDER — IOHEXOL 350 MG/ML SOLN
100.0000 mL | Freq: Once | INTRAVENOUS | Status: AC | PRN
  Administered 2022-11-09: 100 mL via INTRAVENOUS

## 2022-11-09 NOTE — Progress Notes (Signed)
Patient tolerated procedure well. Ambulate w/o difficulty. Denies any lightheadedness or being dizzy. Pt denies any pain at this time. Sitting in chair, pt is encouraged to drink additional water throughout the day and reason explained to patient. Patient verbalized understanding and all questions answered. ABC intact. No further needs at this time. Discharge from procedure area w/o issues.  

## 2022-12-20 ENCOUNTER — Other Ambulatory Visit: Payer: Self-pay

## 2022-12-20 ENCOUNTER — Emergency Department
Admission: EM | Admit: 2022-12-20 | Discharge: 2022-12-20 | Discharge status: Against Medical Advice | Payer: 59 | Attending: Emergency Medicine | Admitting: Emergency Medicine

## 2022-12-20 ENCOUNTER — Emergency Department: Payer: 59

## 2022-12-20 DIAGNOSIS — R0789 Other chest pain: Secondary | ICD-10-CM | POA: Insufficient documentation

## 2022-12-20 DIAGNOSIS — R002 Palpitations: Secondary | ICD-10-CM | POA: Insufficient documentation

## 2022-12-20 DIAGNOSIS — Z5321 Procedure and treatment not carried out due to patient leaving prior to being seen by health care provider: Secondary | ICD-10-CM | POA: Insufficient documentation

## 2022-12-20 DIAGNOSIS — R079 Chest pain, unspecified: Secondary | ICD-10-CM | POA: Diagnosis present

## 2022-12-20 LAB — BASIC METABOLIC PANEL
Anion gap: 6 (ref 5–15)
BUN: 16 mg/dL (ref 8–23)
CO2: 25 mmol/L (ref 22–32)
Calcium: 9.1 mg/dL (ref 8.9–10.3)
Chloride: 103 mmol/L (ref 98–111)
Creatinine, Ser: 0.95 mg/dL (ref 0.44–1.00)
GFR, Estimated: >60 mL/min (ref >60)
Glucose, Bld: 115 mg/dL — ABNORMAL HIGH (ref 70–99)
Potassium: 4.3 mmol/L (ref 3.5–5.1)
Sodium: 134 mmol/L — ABNORMAL LOW (ref 135–145)

## 2022-12-20 LAB — CBC
HCT: 40.3 % (ref 36.0–46.0)
Hemoglobin: 13 g/dL (ref 12.0–15.0)
MCH: 30.5 pg (ref 26.0–34.0)
MCHC: 32.3 g/dL (ref 30.0–36.0)
MCV: 94.6 fL (ref 80.0–100.0)
Platelets: 270 10*3/uL (ref 150–400)
RBC: 4.26 MIL/uL (ref 3.87–5.11)
RDW: 12.9 % (ref 11.5–15.5)
WBC: 8.2 10*3/uL (ref 4.0–10.5)
nRBC: 0 % (ref 0.0–0.2)

## 2022-12-20 LAB — TROPONIN I (HIGH SENSITIVITY): Troponin I (High Sensitivity): <2 ng/L (ref <18)

## 2022-12-20 NOTE — ED Triage Notes (Signed)
Pt reports she has been having some chest discomfort since Thursday, pt reports tonight she felt some palpitations yesterday evening, pt denies any shortness of breath or any other symptom. Pt talks in complete sentences no respiratory distress noted

## 2022-12-20 NOTE — ED Notes (Signed)
Pt left at 03:38

## 2023-11-09 ENCOUNTER — Telehealth: Payer: Self-pay

## 2023-11-09 NOTE — Telephone Encounter (Signed)
Patient states she received a message on mychart stating it was time to schedule her repeat colonoscopy and she is calling to schedule this

## 2023-11-10 NOTE — Telephone Encounter (Signed)
 Message left for patient to return my call.

## 2023-11-10 NOTE — Telephone Encounter (Signed)
 I spoke to pt and she is aware of the date change to May to notify her regarding her repeat colonoscopy for July, nothing further needed at this time

## 2024-03-16 ENCOUNTER — Other Ambulatory Visit: Payer: Self-pay

## 2024-03-16 ENCOUNTER — Telehealth: Payer: Self-pay

## 2024-03-16 DIAGNOSIS — Z860100 Personal history of colon polyps, unspecified: Secondary | ICD-10-CM

## 2024-03-16 DIAGNOSIS — R1310 Dysphagia, unspecified: Secondary | ICD-10-CM

## 2024-03-16 MED ORDER — NA SULFATE-K SULFATE-MG SULF 17.5-3.13-1.6 GM/177ML PO SOLN: 1.0000 | Freq: Once | ORAL | 0 refills | Status: AC

## 2024-03-16 NOTE — Telephone Encounter (Signed)
 Gastroenterology Pre-Procedure Review  Request Date: 04/25/24 Requesting Physician: Dr. Ole Berkeley  PATIENT REVIEW QUESTIONS: The patient responded to the following health history questions as indicated:    1. Are you having any GI issues? yes (strangling off water. Request an EGD previously performed 04/25/19 for heartburn.  ) 2. Do you have a personal history of Polyps? yes (last colonoscopy performed by Dr. Ole Berkeley 04/25/2019 recommended repeat in 5 years) 3. Do you have a family history of Colon Cancer or Polyps? no 4. Diabetes Mellitus? yes (pt takes metformin and has been advised and noted in instructions to stop 2 days before) 5. Joint replacements in the past 12 months?no 6. Major health problems in the past 3 months?no 7. Any artificial heart valves, MVP, or defibrillator?no    MEDICATIONS & ALLERGIES:    Patient reports the following regarding taking any anticoagulation/antiplatelet therapy:   Plavix, Coumadin, Eliquis , Xarelto, Lovenox , Pradaxa, Brilinta, or Effient? yes (Eliquis .  Advice sent to [facility] Dr Barton Like) Aspirin? yes (81 mg daily)  Patient confirms/reports the following medications:  Current Outpatient Medications  Medication Sig Dispense Refill   apixaban  (ELIQUIS ) 5 MG TABS tablet Take 1 tablet (5 mg total) by mouth 2 (two) times daily. 60 tablet 1   aspirin EC 81 MG tablet Take 81 mg by mouth daily.     ezetimibe (ZETIA) 10 MG tablet Take 1 tablet by mouth daily.     gabapentin  (NEURONTIN ) 300 MG capsule Take 300 mg by mouth at bedtime.     lisinopril (ZESTRIL) 5 MG tablet Take 1 tablet by mouth daily.     meloxicam (MOBIC) 7.5 MG tablet Take 7.5 mg by mouth 2 (two) times daily.     metoprolol  succinate (TOPROL  XL) 25 MG 24 hr tablet Take 1 tablet (25 mg total) by mouth daily. 30 tablet 2   APIXABAN  (ELIQUIS ) VTE STARTER PACK (10MG  AND 5MG ) Take as directed on package: start with two-5mg  tablets twice daily for 7 days. On day 8, switch to one-5mg  tablet twice  daily. 1 each 0   Calcium  Carb-Cholecalciferol  (CALCIUM  + VITAMIN D3 PO) Take 1 tablet by mouth daily. (Patient not taking: Reported on 11/02/2022)     cetirizine (ZYRTEC) 10 MG tablet Take 10 mg by mouth daily. (Patient not taking: Reported on 11/02/2022)     Cholecalciferol  (VITAMIN D3) 125 MCG (5000 UT) CAPS Take 5,000 Units by mouth daily. (Patient not taking: Reported on 11/02/2022)     cyclobenzaprine  (FLEXERIL ) 5 MG tablet Take 5 mg by mouth every 12 (twelve) hours as needed for muscle spasms.     metFORMIN (GLUCOPHAGE) 500 MG tablet Take 1 tablet by mouth 2 (two) times daily.     metoprolol  tartrate (LOPRESSOR ) 25 MG tablet Take tablet TWO hours prior to your cardiac CT scan. 1 tablet 0   traMADol -acetaminophen  (ULTRACET) 37.5-325 MG tablet Take 1 tablet by mouth every 6 (six) hours as needed. (Patient not taking: Reported on 11/02/2022)     No current facility-administered medications for this visit.    Patient confirms/reports the following allergies:  Allergies  Allergen Reactions   Penicillins Anaphylaxis and Other (See Comments)    DID THE REACTION INVOLVE: Swelling of the face/tongue/throat, SOB, or low BP? No Sudden or severe rash/hives, skin peeling, or the inside of the mouth or nose? Yes Did it require medical treatment? Yes When did it last happen? 1989 If all above answers are "NO", may proceed with cephalosporin use.    Amoxicillin Other (See Comments)  Unknown   Lodine [Etodolac] Other (See Comments)    Unknown   Morphine And Codeine Nausea And Vomiting   Percocet Cassidy.Castellani ] Nausea And Vomiting    No orders of the defined types were placed in this encounter.   AUTHORIZATION INFORMATION Primary Insurance: 1D#: Group #:  Secondary Insurance: 1D#: Group #:  SCHEDULE INFORMATION: Date: 04/25/24 Time: Location: [facility]

## 2024-03-16 NOTE — Telephone Encounter (Signed)
 Pt lvm yesterday stating that she is calling back to schedule her colonoscopy with EGD with Dr. Ole Berkeley.  LOV 04/04/19 with Dr. Ole Berkeley EGD and Colonoscopy were both performed in July 2020.  I saw where you had spoke with her regarding colonoscopy being due in July but didn't see where she is due for repeat EGD.  Please call patient to discuss request.  Thank you,  Moira Andrews, CMA

## 2024-03-16 NOTE — Progress Notes (Signed)
 EGD added to colonoscopy ordered with Dr. Ole Berkeley 04/25/24.

## 2024-03-27 ENCOUNTER — Telehealth: Payer: Self-pay

## 2024-03-27 NOTE — Telephone Encounter (Signed)
 Received Anticoagulant clearance from Dr.Paraschos -patient is cleared to have procedure.stop the Eliquis  3 days prior to procedure. Patient notified.

## 2024-04-05 ENCOUNTER — Telehealth: Payer: Self-pay

## 2024-04-05 NOTE — Telephone Encounter (Signed)
 Checked status on clearance at [facility] Card[facility] Therisa Drane's office-refaxed per request Attention Alisa.

## 2024-04-06 ENCOUNTER — Telehealth: Payer: Self-pay

## 2024-04-06 NOTE — Telephone Encounter (Signed)
 Anticoagulant clearance received- patient cleared to have surgery-hold Eliquis  3 days prior to procedure.  Left detailed message on patient's VM.

## 2024-04-11 ENCOUNTER — Telehealth: Payer: Self-pay

## 2024-04-11 NOTE — Telephone Encounter (Signed)
 Medical clearance received form Therisa Pierre -patient is cleared to have procedure.-Hold Eliquis  3 days prior to procedure and restart 1 day after. Patient notified.

## 2024-04-25 ENCOUNTER — Ambulatory Visit: Admitting: Anesthesiology

## 2024-04-25 ENCOUNTER — Ambulatory Visit
Admission: RE | Admit: 2024-04-25 | Discharge: 2024-04-25 | Disposition: A | Discharge status: Home / Self Care | Attending: Gastroenterology | Admitting: Gastroenterology

## 2024-04-25 ENCOUNTER — Encounter: Payer: Self-pay | Admitting: Gastroenterology

## 2024-04-25 ENCOUNTER — Encounter
Admission: RE | Disposition: A | Discharge status: Home / Self Care | Payer: Self-pay | Source: Home / Self Care | Attending: Gastroenterology

## 2024-04-25 DIAGNOSIS — I4891 Unspecified atrial fibrillation: Secondary | ICD-10-CM | POA: Diagnosis not present

## 2024-04-25 DIAGNOSIS — K449 Diaphragmatic hernia without obstruction or gangrene: Secondary | ICD-10-CM | POA: Diagnosis not present

## 2024-04-25 DIAGNOSIS — Z1211 Encounter for screening for malignant neoplasm of colon: Secondary | ICD-10-CM | POA: Diagnosis not present

## 2024-04-25 DIAGNOSIS — R1319 Other dysphagia: Secondary | ICD-10-CM

## 2024-04-25 DIAGNOSIS — K5730 Diverticulosis of large intestine without perforation or abscess without bleeding: Secondary | ICD-10-CM | POA: Insufficient documentation

## 2024-04-25 DIAGNOSIS — Z7901 Long term (current) use of anticoagulants: Secondary | ICD-10-CM | POA: Diagnosis not present

## 2024-04-25 DIAGNOSIS — K317 Polyp of stomach and duodenum: Secondary | ICD-10-CM | POA: Insufficient documentation

## 2024-04-25 DIAGNOSIS — K641 Second degree hemorrhoids: Secondary | ICD-10-CM | POA: Diagnosis not present

## 2024-04-25 DIAGNOSIS — R1310 Dysphagia, unspecified: Secondary | ICD-10-CM | POA: Diagnosis not present

## 2024-04-25 DIAGNOSIS — Z6833 Body mass index (BMI) 33.0-33.9, adult: Secondary | ICD-10-CM | POA: Insufficient documentation

## 2024-04-25 DIAGNOSIS — E66813 Obesity, class 3: Secondary | ICD-10-CM | POA: Insufficient documentation

## 2024-04-25 DIAGNOSIS — E119 Type 2 diabetes mellitus without complications: Secondary | ICD-10-CM | POA: Insufficient documentation

## 2024-04-25 DIAGNOSIS — Z7984 Long term (current) use of oral hypoglycemic drugs: Secondary | ICD-10-CM | POA: Insufficient documentation

## 2024-04-25 DIAGNOSIS — K219 Gastro-esophageal reflux disease without esophagitis: Secondary | ICD-10-CM | POA: Diagnosis not present

## 2024-04-25 DIAGNOSIS — Z860100 Personal history of colon polyps, unspecified: Secondary | ICD-10-CM

## 2024-04-25 DIAGNOSIS — Z860101 Personal history of adenomatous and serrated colon polyps: Secondary | ICD-10-CM | POA: Diagnosis present

## 2024-04-25 HISTORY — DX: Type 2 diabetes mellitus without complications: E11.9

## 2024-04-25 HISTORY — PX: ESOPHAGOGASTRODUODENOSCOPY: SHX5428

## 2024-04-25 HISTORY — PX: COLONOSCOPY: SHX5424

## 2024-04-25 LAB — GLUCOSE, CAPILLARY: Glucose-Capillary: 123 mg/dL — ABNORMAL HIGH (ref 70–99)

## 2024-04-25 SURGERY — COLONOSCOPY
Anesthesia: General

## 2024-04-25 SURGERY — EGD (ESOPHAGOGASTRODUODENOSCOPY)
Anesthesia: General

## 2024-04-25 MED ORDER — SODIUM CHLORIDE 0.9 % IV SOLN: INTRAVENOUS | Status: DC

## 2024-04-25 MED ORDER — DEXMEDETOMIDINE HCL IN NACL 80 MCG/20ML IV SOLN
INTRAVENOUS | Status: DC | PRN
  Administered 2024-04-25: 4 ug via INTRAVENOUS
  Administered 2024-04-25: 8 ug via INTRAVENOUS

## 2024-04-25 MED ORDER — LIDOCAINE HCL (CARDIAC) PF 100 MG/5ML IV SOSY
PREFILLED_SYRINGE | INTRAVENOUS | Status: DC | PRN
  Administered 2024-04-25: 50 mg via INTRAVENOUS

## 2024-04-25 MED ORDER — PROPOFOL 10 MG/ML IV BOLUS
INTRAVENOUS | Status: DC | PRN
  Administered 2024-04-25: 50 mg via INTRAVENOUS
  Administered 2024-04-25: 80 mg via INTRAVENOUS
  Administered 2024-04-25: 150 ug/kg/min via INTRAVENOUS
  Administered 2024-04-25: 20 mg via INTRAVENOUS

## 2024-04-25 MED ORDER — GLYCOPYRROLATE 0.2 MG/ML IJ SOLN
INTRAMUSCULAR | Status: DC | PRN
  Administered 2024-04-25: .2 mg via INTRAVENOUS

## 2024-04-25 NOTE — Op Note (Signed)
 [facility] Gastroenterology Patient Name: Caitlin Kelley Procedure Date: 04/25/2024 9:55 AM MRN: [id number] Account #: [id number] Date of Birth: November 03, 1955 Admit Type: Outpatient Age: 68 Room: [facility] ENDO ROOM 4 Gender: Female Note Status: Finalized Instrument Name: Arvis [id number] Procedure:             Colonoscopy Indications:           High risk colon cancer surveillance: Personal history                         of colonic polyps Providers:             Rogelia Copping MD, MD Referring MD:          Channing SQUIBB. Donal (Referring MD) Medicines:             Propofol  per Anesthesia Complications:         No immediate complications. Procedure:             Pre-Anesthesia Assessment:                        - Prior to the procedure, a History and Physical was                         performed, and patient medications and allergies were                         reviewed. The patient's tolerance of previous                         anesthesia was also reviewed. The risks and benefits                         of the procedure and the sedation options and risks                         were discussed with the patient. All questions were                         answered, and informed consent was obtained. Prior                         Anticoagulants: The patient has taken Eliquis                          (apixaban ), last dose was 3 days prior to procedure.                         ASA Grade Assessment: II - A patient with mild                         systemic disease. After reviewing the risks and                         benefits, the patient was deemed in satisfactory                         condition to undergo the procedure.  After obtaining informed consent, the colonoscope was                         passed under direct vision. Throughout the procedure,                         the patient's blood pressure, pulse, and oxygen                          saturations were monitored continuously. The                         Colonoscope was introduced through the anus and                         advanced to the the cecum, identified by appendiceal                         orifice and ileocecal valve. The colonoscopy was                         performed without difficulty. The patient tolerated                         the procedure well. The quality of the bowel                         preparation was excellent. Findings:      The perianal and digital rectal examinations were normal.      Non-bleeding internal hemorrhoids were found during retroflexion. The       hemorrhoids were Grade II (internal hemorrhoids that prolapse but reduce       spontaneously).      A few small-mouthed diverticula were found in the sigmoid colon. Impression:            - Non-bleeding internal hemorrhoids.                        - Diverticulosis in the sigmoid colon.                        - No specimens collected. Recommendation:        - Discharge patient to home.                        - Resume previous diet.                        - Continue present medications.                        - Repeat colonoscopy in 7 years for surveillance. Procedure Code(s):     --- Professional ---                        [phone], Colonoscopy, flexible; diagnostic, including                         collection of specimen(s) by brushing or washing, when  performed (separate procedure) Diagnosis Code(s):     --- Professional ---                        Z86.010, Personal history of colonic polyps CPT copyright 2022 American Medical Association. All rights reserved. The codes documented in this report are preliminary and upon coder review may  be revised to meet current compliance requirements. Rogelia Copping MD, MD 04/25/2024 10:32:04 AM This report has been signed electronically. Number of Addenda: 0 Note Initiated On: 04/25/2024 9:55 AM Scope Withdrawal Time: 0  hours 6 minutes 21 seconds  Total Procedure Duration: 0 hours 19 minutes 15 seconds  Estimated Blood Loss:  Estimated blood loss: none.      [facility]

## 2024-04-25 NOTE — Op Note (Signed)
 [facility] Patient Name: Caitlin Kelley Procedure Date: 04/25/2024 9:56 AM MRN: [id number] Account #: [id number] Date of Birth: [birth year] Admit Type: Outpatient Age: 68 Room: [facility] ENDO ROOM 4 Gender: Female Note Status: Finalized Instrument Name: Barnie Endoscope [id number] Procedure:             Upper GI endoscopy Indications:           Dysphagia Providers:             Rogelia Copping MD, MD Referring MD:          Channing SQUIBB. Donal (Referring MD) Medicines:             Propofol  per Anesthesia Complications:         No immediate complications. Procedure:             Pre-Anesthesia Assessment:                        - Prior to the procedure, a History and Physical was                         performed, and patient medications and allergies were                         reviewed. The patient's tolerance of previous                         anesthesia was also reviewed. The risks and benefits                         of the procedure and the sedation options and risks                         were discussed with the patient. All questions were                         answered, and informed consent was obtained. Prior                         Anticoagulants: The patient has taken no anticoagulant                         or antiplatelet agents. ASA Grade Assessment: II - A                         patient with mild systemic disease. After reviewing                         the risks and benefits, the patient was deemed in                         satisfactory condition to undergo the procedure.                        After obtaining informed consent, the endoscope was                         passed under direct vision. Throughout the procedure,  the patient's blood pressure, pulse, and oxygen                         saturations were monitored continuously. The Endoscope                         was introduced through the mouth, and advanced to  the                         second part of duodenum. The upper GI endoscopy was                         accomplished without difficulty. The patient tolerated                         the procedure well. Findings:      The examined esophagus was normal. A TTS dilator was passed through the       scope. Dilation with a 15-16.5-18 mm balloon dilator was performed to 18       mm.      Multiple sessile polyps were found in the gastric fundus.      The examined duodenum was normal. Impression:            - Normal esophagus. Dilated.                        - Multiple gastric polyps.                        - Normal examined duodenum.                        - No specimens collected. Recommendation:        - Discharge patient to home.                        - Resume previous diet.                        - Continue present medications.                        - Perform a colonoscopy today. Procedure Code(s):     --- Professional ---                        [phone], Esophagogastroduodenoscopy, flexible,                         transoral; with transendoscopic balloon dilation of                         esophagus (less than 30 mm diameter) Diagnosis Code(s):     --- Professional ---                        R13.10, Dysphagia, unspecified CPT copyright 2022 American Medical Association. All rights reserved. The codes documented in this report are preliminary and upon coder review may  be revised to meet current compliance requirements. Rogelia Copping MD, MD 04/25/2024 10:08:10 AM This report has been signed electronically. Number of Addenda: 0 Note Initiated On:  04/25/2024 9:56 AM Estimated Blood Loss:  Estimated blood loss: none.      [facility]

## 2024-04-25 NOTE — H&P (Signed)
 Caitlin Copping, MD [facility] [address], [address] [place], [place] [address] Phone:[phone] Fax : [phone]  Primary Care Physician:  Donal Channing SQUIBB, FNP Primary Gastroenterologist:  Dr. Copping  Pre-Procedure History & Physical: HPI:  Caitlin Kelley is a 68 y.o. female is here for an endoscopy and colonoscopy.   Past Medical History:  Diagnosis Date   Arthritis    Complication of anesthesia    Diabetes mellitus without complication (HCC)    GERD (gastroesophageal reflux disease)    History of hiatal hernia    IBS (irritable bowel syndrome)    PONV (postoperative nausea and vomiting)    Pre-diabetes     Past Surgical History:  Procedure Laterality Date   ABDOMINAL HYSTERECTOMY     BACK SURGERY     CARPAL TUNNEL RELEASE     COLONOSCOPY WITH PROPOFOL  N/A 04/25/2019   Procedure: COLONOSCOPY WITH PROPOFOL ;  Surgeon: Kelley Rogelia, MD;  Location: [facility] ENDOSCOPY;  Service: Endoscopy;  Laterality: N/A;   ESOPHAGOGASTRODUODENOSCOPY (EGD) WITH PROPOFOL  N/A 04/25/2019   Procedure: ESOPHAGOGASTRODUODENOSCOPY (EGD) WITH PROPOFOL ;  Surgeon: Kelley Rogelia, MD;  Location: [facility] ENDOSCOPY;  Service: Endoscopy;  Laterality: N/A;   KNEE SURGERY     SHOULDER SURGERY     TOTAL KNEE REVISION Right 10/12/2018   Procedure: TOTAL KNEE REVISION;  Surgeon: Leora Lynwood SAUNDERS, MD;  Location: [facility] ORS;  Service: Orthopedics;  Laterality: Right;    Prior to Admission medications   Medication Sig Start Date End Date Taking? Authorizing Provider  apixaban  (ELIQUIS ) 5 MG TABS tablet Take 1 tablet (5 mg total) by mouth 2 (two) times daily. 11/03/22   Claudene Rover, MD  APIXABAN  (ELIQUIS ) VTE STARTER PACK (10MG  AND 5MG ) Take as directed on package: start with two-5mg  tablets twice daily for 7 days. On day 8, switch to one-5mg  tablet twice daily. 11/03/22   Claudene Rover, MD  aspirin EC 81 MG tablet Take 81 mg by mouth daily.    [provider]  Calcium  Carb-Cholecalciferol  (CALCIUM  + VITAMIN D3 PO) Take 1  tablet by mouth daily.    [provider]  Cholecalciferol  (VITAMIN D3) 125 MCG (5000 UT) CAPS Take 5,000 Units by mouth daily.    [provider]  ezetimibe (ZETIA) 10 MG tablet Take 1 tablet by mouth daily.    [provider]  gabapentin  (NEURONTIN ) 300 MG capsule Take 300 mg by mouth at bedtime.    [provider]  lisinopril (ZESTRIL) 5 MG tablet Take 1 tablet by mouth daily.    [provider]  meloxicam (MOBIC) 7.5 MG tablet Take 7.5 mg by mouth 2 (two) times daily. 12/13/18   [provider]  metFORMIN (GLUCOPHAGE) 500 MG tablet Take 1 tablet by mouth 2 (two) times daily.    [provider]  metoprolol  succinate (TOPROL  XL) 25 MG 24 hr tablet Take 1 tablet (25 mg total) by mouth daily. 11/03/22 03/16/24  Claudene Rover, MD    Allergies as of 03/16/2024 - Review Complete 12/20/2022  Allergen Reaction Noted   Penicillins Anaphylaxis and Other (See Comments) 04/08/2015   Amoxicillin Other (See Comments) 04/08/2015   Lodine [etodolac] Other (See Comments) 04/08/2015   Morphine and codeine Nausea And Vomiting 04/08/2015   Percocet [oxycodone-acetaminophen ] Nausea And Vomiting 04/08/2015    No family history on file.  Social History   Socioeconomic History   Marital status: Single    Spouse name: Not on file   Number of children: Not on file   Years of education:  Not on file   Highest education level: Not on file  Occupational History   Not on file  Tobacco Use   Smoking status: Never   Smokeless tobacco: Never  Vaping Use   Vaping status: Never Used  Substance and Sexual Activity   Alcohol use: No   Drug use: No   Sexual activity: Not on file  Other Topics Concern   Not on file  Social History Narrative   Not on file   Social Drivers of Health   Financial Resource Strain: Not on file  Food Insecurity: Not on file  Transportation Needs: Not on file  Physical Activity: Not on file  Stress: Not on file   Social Connections: Not on file  Intimate Partner Violence: Not on file    Review of Systems: See HPI, otherwise negative ROS  Physical Exam: BP 134/72   Pulse (!) 102   Temp (!) 97.4 F (36.3 C) (Temporal)   Resp 18   Ht 4' 10 (1.473 m)   Wt 73.1 kg   SpO2 97%   BMI 33.69 kg/m  General:   Alert,  pleasant and cooperative in NAD Head:  Normocephalic and atraumatic. Neck:  Supple; no masses or thyromegaly. Lungs:  Clear throughout to auscultation.    Heart:  Regular rate and rhythm. Abdomen:  Soft, nontender and nondistended. Normal bowel sounds, without guarding, and without rebound.   Neurologic:  Alert and  oriented x4;  grossly normal neurologically.  Impression/Plan: Caitlin Kelley is here for an endoscopy and colonoscopy to be performed for dysphagia and a history of adenomatous polyps on 2020   Risks, benefits, limitations, and alternatives regarding  endoscopy and colonoscopy have been reviewed with the patient.  Questions have been answered.  All parties agreeable.   Caitlin Copping, MD  04/25/2024, 9:47 AM

## 2024-04-25 NOTE — Anesthesia Procedure Notes (Signed)
 Date/Time: 04/25/2024 10:00 AM  Performed by: Dominica Krabbe, CRNAPre-anesthesia Checklist: Patient identified, Emergency Drugs available, Suction available, Patient being monitored and Timeout performed Patient Re-evaluated:Patient Re-evaluated prior to induction Oxygen Delivery Method: Nasal cannula Preoxygenation: Pre-oxygenation with 100% oxygen Induction Type: IV induction

## 2024-04-25 NOTE — Anesthesia Postprocedure Evaluation (Signed)
 Anesthesia Post Note  Patient: Caitlin Kelley  Procedure(s) Performed: COLONOSCOPY EGD (ESOPHAGOGASTRODUODENOSCOPY) Balloon dilation wire-guided  Patient location during evaluation: PACU Anesthesia Type: General Level of consciousness: awake Pain management: satisfactory to patient Vital Signs Assessment: post-procedure vital signs reviewed and stable Respiratory status: spontaneous breathing Cardiovascular status: stable Anesthetic complications: no   No notable events documented.   Last Vitals:  Vitals:   04/25/24 1044 04/25/24 1054  BP: 122/72 136/80  Pulse: 82 82  Resp: 17 19  Temp:    SpO2: 99% 96%    Last Pain:  Vitals:   04/25/24 1054  TempSrc:   PainSc: 0-No pain                 VAN STAVEREN,Jameir Ake

## 2024-04-25 NOTE — Transfer of Care (Signed)
 Immediate Anesthesia Transfer of Care Note  Patient: Caitlin Kelley  Procedure(s) Performed: COLONOSCOPY EGD (ESOPHAGOGASTRODUODENOSCOPY) Balloon dilation wire-guided  Patient Location: Endoscopy Unit  Anesthesia Type:General  Level of Consciousness: awake and drowsy  Airway & Oxygen Therapy: Patient Spontanous Breathing  Post-op Assessment: Report given to RN and Post -op Vital signs reviewed and stable  Post vital signs: Reviewed and stable  Last Vitals:  Vitals Value Taken Time  BP 114/74 04/25/24 10:34  Temp 36.2 C 04/25/24 10:34  Pulse 82 04/25/24 10:34  Resp 38 04/25/24 10:34  SpO2 95 % 04/25/24 10:34  Vitals shown include unfiled device data.  Last Pain:  Vitals:   04/25/24 0940  TempSrc: Temporal  PainSc: 0-No pain         Complications: No notable events documented.

## 2024-04-25 NOTE — Anesthesia Preprocedure Evaluation (Signed)
 Anesthesia Evaluation  Patient identified by MRN, date of birth, ID band Patient awake    Reviewed: Allergy & Precautions, NPO status , Patient's Chart, lab work & pertinent test results  Airway Mallampati: II  TM Distance: >3 FB Neck ROM: full    Dental  (+) Upper Dentures, Lower Dentures   Pulmonary neg pulmonary ROS   Pulmonary exam normal breath sounds clear to auscultation       Cardiovascular Exercise Tolerance: Good negative cardio ROS Normal cardiovascular exam+ dysrhythmias Atrial Fibrillation  Rhythm:Irregular     Neuro/Psych negative neurological ROS  negative psych ROS   GI/Hepatic negative GI ROS, Neg liver ROS, hiatal hernia,GERD  ,,  Endo/Other  negative endocrine ROSdiabetes, Type 2, Oral Hypoglycemic Agents  Class 3 obesity  Renal/GU negative Renal ROS  negative genitourinary   Musculoskeletal   Abdominal  (+) + obese  Peds negative pediatric ROS (+)  Hematology negative hematology ROS (+)   Anesthesia Other Findings Past Medical History: No date: Arthritis No date: Complication of anesthesia No date: Diabetes mellitus without complication (HCC) No date: GERD (gastroesophageal reflux disease) No date: History of hiatal hernia No date: IBS (irritable bowel syndrome) No date: PONV (postoperative nausea and vomiting) No date: Pre-diabetes  Past Surgical History: No date: ABDOMINAL HYSTERECTOMY No date: BACK SURGERY No date: CARPAL TUNNEL RELEASE 04/25/2019: COLONOSCOPY WITH PROPOFOL ; N/A     Comment:  Procedure: COLONOSCOPY WITH PROPOFOL ;  Surgeon: Jinny Carmine, MD;  Location: [facility] ENDOSCOPY;  Service:               Endoscopy;  Laterality: N/A; 04/25/2019: ESOPHAGOGASTRODUODENOSCOPY (EGD) WITH PROPOFOL ; N/A     Comment:  Procedure: ESOPHAGOGASTRODUODENOSCOPY (EGD) WITH               PROPOFOL ;  Surgeon: Jinny Carmine, MD;  Location: [facility]               ENDOSCOPY;  Service:  Endoscopy;  Laterality: N/A; No date: KNEE SURGERY No date: SHOULDER SURGERY 10/12/2018: TOTAL KNEE REVISION; Right     Comment:  Procedure: TOTAL KNEE REVISION;  Surgeon: Leora Lynwood SAUNDERS, MD;  Location: [facility] ORS;  Service: Orthopedics;                Laterality: Right;  BMI    Body Mass Index: 33.69 kg/m      Reproductive/Obstetrics negative OB ROS                              Anesthesia Physical Anesthesia Plan  ASA: 3  Anesthesia Plan: General   Post-op Pain Management:    Induction: Intravenous  PONV Risk Score and Plan: Propofol  infusion and TIVA  Airway Management Planned: Natural Airway and Nasal Cannula  Additional Equipment:   Intra-op Plan:   Post-operative Plan:   Informed Consent: I have reviewed the patients History and Physical, chart, labs and discussed the procedure including the risks, benefits and alternatives for the proposed anesthesia with the patient or authorized representative who has indicated his/her understanding and acceptance.     Dental Advisory Given  Plan Discussed with: CRNA  Anesthesia Plan Comments:         Anesthesia Quick Evaluation

## 2024-04-26 ENCOUNTER — Encounter: Payer: Self-pay | Admitting: Gastroenterology

## 2024-05-09 ENCOUNTER — Other Ambulatory Visit: Payer: Self-pay | Admitting: Family Medicine

## 2024-05-09 DIAGNOSIS — Z1231 Encounter for screening mammogram for malignant neoplasm of breast: Secondary | ICD-10-CM

## 2024-05-25 ENCOUNTER — Ambulatory Visit
Admission: RE | Admit: 2024-05-25 | Discharge: 2024-05-25 | Disposition: A | Discharge status: Home / Self Care | Source: Ambulatory Visit | Attending: Family Medicine | Admitting: Family Medicine

## 2024-05-25 DIAGNOSIS — Z1231 Encounter for screening mammogram for malignant neoplasm of breast: Secondary | ICD-10-CM | POA: Insufficient documentation
